# Patient Record
Sex: Male | Born: 1935 | Race: White | Hispanic: No | Marital: Married | Smoking: Former smoker
Health system: Southern US, Community
[De-identification: ages and names within clinical notes are randomized; demographics above are authoritative.]

## PROBLEM LIST (undated history)

## (undated) DIAGNOSIS — C801 Malignant (primary) neoplasm, unspecified: Secondary | ICD-10-CM

## (undated) DIAGNOSIS — G4730 Sleep apnea, unspecified: Secondary | ICD-10-CM

## (undated) DIAGNOSIS — F419 Anxiety disorder, unspecified: Secondary | ICD-10-CM

## (undated) DIAGNOSIS — M1990 Unspecified osteoarthritis, unspecified site: Secondary | ICD-10-CM

## (undated) DIAGNOSIS — I10 Essential (primary) hypertension: Secondary | ICD-10-CM

## (undated) DIAGNOSIS — J8410 Pulmonary fibrosis, unspecified: Secondary | ICD-10-CM

## (undated) DIAGNOSIS — K219 Gastro-esophageal reflux disease without esophagitis: Secondary | ICD-10-CM

## (undated) DIAGNOSIS — E119 Type 2 diabetes mellitus without complications: Secondary | ICD-10-CM

## (undated) HISTORY — PX: OTHER SURGICAL HISTORY: SHX169

## (undated) HISTORY — PX: PROSTATE SURGERY: SHX751

---

## 2005-11-07 ENCOUNTER — Ambulatory Visit: Payer: Self-pay | Admitting: Cardiology

## 2005-11-09 ENCOUNTER — Ambulatory Visit: Payer: Self-pay | Admitting: Cardiology

## 2005-11-09 ENCOUNTER — Inpatient Hospital Stay (HOSPITAL_BASED_OUTPATIENT_CLINIC_OR_DEPARTMENT_OTHER): Admission: RE | Admit: 2005-11-09 | Discharge: 2005-11-09 | Payer: Self-pay | Admitting: Cardiology

## 2005-11-18 ENCOUNTER — Ambulatory Visit: Payer: Self-pay | Admitting: Cardiology

## 2005-11-24 ENCOUNTER — Ambulatory Visit: Payer: Self-pay | Admitting: Cardiology

## 2005-12-19 ENCOUNTER — Ambulatory Visit: Payer: Self-pay | Admitting: Cardiology

## 2006-10-06 ENCOUNTER — Ambulatory Visit: Payer: Self-pay | Admitting: Cardiology

## 2006-10-24 ENCOUNTER — Encounter: Payer: Self-pay | Admitting: Internal Medicine

## 2006-11-28 ENCOUNTER — Encounter: Payer: Self-pay | Admitting: Internal Medicine

## 2007-02-16 ENCOUNTER — Ambulatory Visit: Payer: Self-pay | Admitting: Internal Medicine

## 2007-02-16 LAB — CONVERTED CEMR LAB
BUN: 23 mg/dL (ref 6–23)
Basophils Relative: 0.8 % (ref 0.0–1.0)
Calcium: 9.5 mg/dL (ref 8.4–10.5)
Chloride: 108 meq/L (ref 96–112)
Eosinophils Absolute: 0.3 10*3/uL (ref 0.0–0.6)
GFR calc Af Amer: 70 mL/min
GFR calc non Af Amer: 58 mL/min
Lymphocytes Relative: 27.7 % (ref 12.0–46.0)
MCV: 89.8 fL (ref 78.0–100.0)
Monocytes Relative: 10.2 % (ref 3.0–11.0)
Neutro Abs: 3.2 10*3/uL (ref 1.4–7.7)
Platelets: 230 10*3/uL (ref 150–400)
Pro B Natriuretic peptide (BNP): 50 pg/mL (ref 0.0–100.0)
RBC: 3.85 M/uL — ABNORMAL LOW (ref 4.22–5.81)
Sed Rate: 38 mm/hr — ABNORMAL HIGH (ref 0–20)
TSH: 1.73 microintl units/mL (ref 0.35–5.50)
WBC: 5.7 10*3/uL (ref 4.5–10.5)

## 2007-03-27 ENCOUNTER — Ambulatory Visit: Payer: Self-pay | Admitting: Internal Medicine

## 2007-04-12 ENCOUNTER — Encounter: Payer: Self-pay | Admitting: Internal Medicine

## 2007-04-12 DIAGNOSIS — E119 Type 2 diabetes mellitus without complications: Secondary | ICD-10-CM

## 2007-04-12 DIAGNOSIS — G4730 Sleep apnea, unspecified: Secondary | ICD-10-CM | POA: Insufficient documentation

## 2007-04-12 DIAGNOSIS — I10 Essential (primary) hypertension: Secondary | ICD-10-CM | POA: Insufficient documentation

## 2007-05-10 ENCOUNTER — Ambulatory Visit: Payer: Self-pay | Admitting: Internal Medicine

## 2007-05-10 DIAGNOSIS — J8410 Pulmonary fibrosis, unspecified: Secondary | ICD-10-CM

## 2007-10-10 ENCOUNTER — Ambulatory Visit: Payer: Self-pay | Admitting: Internal Medicine

## 2007-11-27 ENCOUNTER — Ambulatory Visit: Payer: Self-pay | Admitting: Internal Medicine

## 2007-11-27 DIAGNOSIS — R05 Cough: Secondary | ICD-10-CM

## 2007-11-27 DIAGNOSIS — R0602 Shortness of breath: Secondary | ICD-10-CM

## 2007-11-27 DIAGNOSIS — R059 Cough, unspecified: Secondary | ICD-10-CM | POA: Insufficient documentation

## 2007-12-31 ENCOUNTER — Ambulatory Visit: Payer: Self-pay | Admitting: Internal Medicine

## 2008-01-02 LAB — CONVERTED CEMR LAB
BUN: 25 mg/dL — ABNORMAL HIGH (ref 6–23)
Basophils Absolute: 0.1 10*3/uL (ref 0.0–0.1)
Calcium: 8.7 mg/dL (ref 8.4–10.5)
Creatinine, Ser: 1.6 mg/dL — ABNORMAL HIGH (ref 0.4–1.5)
Eosinophils Absolute: 0.5 10*3/uL (ref 0.0–0.7)
GFR calc non Af Amer: 45 mL/min
Lymphocytes Relative: 27.1 % (ref 12.0–46.0)
Monocytes Absolute: 0.7 10*3/uL (ref 0.1–1.0)
Monocytes Relative: 12.6 % — ABNORMAL HIGH (ref 3.0–12.0)
Neutrophils Relative %: 50.6 % (ref 43.0–77.0)
Platelets: 198 10*3/uL (ref 150–400)
Pro B Natriuretic peptide (BNP): 54 pg/mL (ref 0.0–100.0)
RDW: 16.1 % — ABNORMAL HIGH (ref 11.5–14.6)
WBC: 5.7 10*3/uL (ref 4.5–10.5)

## 2008-05-23 DIAGNOSIS — J8410 Pulmonary fibrosis, unspecified: Secondary | ICD-10-CM

## 2008-05-23 HISTORY — DX: Pulmonary fibrosis, unspecified: J84.10

## 2008-05-26 ENCOUNTER — Telehealth (INDEPENDENT_AMBULATORY_CARE_PROVIDER_SITE_OTHER): Payer: Self-pay | Admitting: *Deleted

## 2009-04-08 ENCOUNTER — Ambulatory Visit: Payer: Self-pay | Admitting: Internal Medicine

## 2009-04-08 DIAGNOSIS — N259 Disorder resulting from impaired renal tubular function, unspecified: Secondary | ICD-10-CM | POA: Insufficient documentation

## 2009-04-09 LAB — CONVERTED CEMR LAB
BUN: 21 mg/dL (ref 6–23)
Basophils Relative: 1.2 % (ref 0.0–3.0)
CO2: 26 meq/L (ref 19–32)
Calcium: 9.1 mg/dL (ref 8.4–10.5)
Creatinine, Ser: 1.7 mg/dL — ABNORMAL HIGH (ref 0.4–1.5)
Eosinophils Absolute: 0.4 10*3/uL (ref 0.0–0.7)
Iron: 144 ug/dL (ref 42–165)
Lymphocytes Relative: 18.5 % (ref 12.0–46.0)
MCHC: 33.1 g/dL (ref 30.0–36.0)
MCV: 95.8 fL (ref 78.0–100.0)
Monocytes Absolute: 0.9 10*3/uL (ref 0.1–1.0)
Neutrophils Relative %: 61 % (ref 43.0–77.0)
Platelets: 270 10*3/uL (ref 150.0–400.0)
RBC: 3.42 M/uL — ABNORMAL LOW (ref 4.22–5.81)
Saturation Ratios: 34.5 % (ref 20.0–50.0)
Transferrin: 297.9 mg/dL (ref 212.0–360.0)
WBC: 7.1 10*3/uL (ref 4.5–10.5)

## 2009-04-30 ENCOUNTER — Telehealth (INDEPENDENT_AMBULATORY_CARE_PROVIDER_SITE_OTHER): Payer: Self-pay | Admitting: *Deleted

## 2009-04-30 ENCOUNTER — Ambulatory Visit: Payer: Self-pay | Admitting: Pulmonary Disease

## 2009-05-01 ENCOUNTER — Telehealth (INDEPENDENT_AMBULATORY_CARE_PROVIDER_SITE_OTHER): Payer: Self-pay | Admitting: *Deleted

## 2009-05-06 ENCOUNTER — Encounter: Payer: Self-pay | Admitting: Pulmonary Disease

## 2010-01-26 ENCOUNTER — Telehealth (INDEPENDENT_AMBULATORY_CARE_PROVIDER_SITE_OTHER): Payer: Self-pay | Admitting: *Deleted

## 2010-02-16 ENCOUNTER — Ambulatory Visit: Payer: Self-pay | Admitting: Cardiology

## 2010-04-27 ENCOUNTER — Ambulatory Visit: Payer: Self-pay | Admitting: Internal Medicine

## 2010-04-28 ENCOUNTER — Telehealth: Payer: Self-pay | Admitting: Internal Medicine

## 2010-04-30 ENCOUNTER — Ambulatory Visit: Payer: Self-pay | Admitting: Internal Medicine

## 2010-06-22 NOTE — Assessment & Plan Note (Signed)
Summary: Pulmonary/ ext f/u PF with desat x 1 lap > hrct next   Copy to:  Dr. Sherene Sires Primary Provider/Referring Provider:  Wyvonnia Lora  CC:  Dyspnea- the same.  History of Present Illness: 77 -yowm quit smoking in 1972  with pulmonary fibrosis by chest x-ray the dating back to at least June of 2007 corresponding to complaints of mild dyspnea on exertion.  PFTs dated November 2008 indicated diffusing capacity of only 38% predicted.  However, there was really no significant overall change in the chest x-ray pattern that dating back to June of 2007.  The patient already qualified for both nocturnal bipap ([place]) and oxygen with exercise although he admits he does not use oxygen with exercise (doesn't feel it helps).    The other symptom that was bothering him was chronic dry upper airway daytime cough that seemed better when he switched from lisinopril to Benicar     April 08, 2009 ov Acute visit.  Pt states that his breathing has progressively worsened over the past 6 months.  He states that he gets SOB just walking from room to room.  Also c/o cough worsening.  Sometimes cough is prod with clear sputum and worse after eats, now  Uses [facility] parking rec    April 27, 2010 ov cc doe better to point where can go walk around large store wihout 02 using 4 pronged walker and no cough, no noct co's.  Pt denies any significant sore throat, dysphagia, itching, sneezing,  nasal congestion or excess secretions,  fever, chills, sweats, unintended wt loss, pleuritic or exertional cp, hempoptysis, variability  in activity tolerance  orthopnea pnd or leg swelling. Pt also denies any obvious fluctuation in symptoms with weather or environmental change or other alleviating or aggravating factors.       Current Medications (verified): 1)  Adprin B 325 Mg  Tabs (Aspirin Buf(Cacarb-Mgcarb-Mgo)) .... Once Daily 2)  Benefiber  Powd (Wheat Dextrin) .... As Directed  Every Morning 3)  Humulin 70/30 70-30 %  Susp  (Insulin Isophane & Regular) .... 45 Units Two Times A Day 4)  Calcuim D 600mg  .... 2 Tab Once Daily 5)  Tylenol Extra Strength 500 Mg  Tabs (Acetaminophen) .... As Needed 6)  Diovan 320 Mg Tabs (Valsartan) .... Take 1 Tablet By Mouth Once A Day 7)  Melatonin 3 Mg Tabs (Melatonin) .... 3 At Bedtime 8)  Multivitamins  Tabs (Multiple Vitamin) .Marland Kitchen.. 1 Once Daily 9)  Lyrica (? Strength) .Marland Kitchen.. 1 Once Daily 10)  * Cpap @ 10 and 3lpm At Bedtime 11)  Pepcid 20 Mg Tabs (Famotidine) .... Take 1 Tablet By Mouth Once A Day As Needed 12)  Diazepam 5 Mg Tabs (Diazepam) .... As Needed For Sleep 13)  Magnesium Oxide 250 Mg Tabs (Magnesium Oxide) .... Take 1 Tablet By Mouth Three Times A Day 14)  Hydrocodone-Acetaminophen 5-500 Mg Tabs (Hydrocodone-Acetaminophen) .Marland Kitchen.. 1 Every 4-6 Hrs As Needed  Allergies (verified): No Known Drug Allergies  Past History:  Past Medical History: OBESITY, MORBID (ICD-278.01) PULMONARY FIBROSIS ILD POST INFLAMMATORY CHRONIC (ICD-515).................Marland KitchenWert   - PFTs 11//4/08 vital capacity 75% diffusing capacity 38%                11/27/07   vital capacity 72% diffusing capacity 41%               04/30/09  Vital capacity 71%diffusing capacity 41% TUBERCULOSIS EXPOSURE (ICD-V01.1) SLEEP APNEA (ICD-780.57) DM (ICD-250.00) HYPERTENSION (ICD-401.9)  Vital Signs:  Patient profile:  77 year old male Weight:      214 pounds BMI:     30.82 O2 Sat:      96 % on Room air Temp:     97.9 degrees F oral Pulse rate:   70 / minute BP sitting:   110 / 60  (left arm)  Vitals Entered By: Vernie Murders (April 27, 2010 1:39 PM)  O2 Flow:  Room air  Serial Vital Signs/Assessments:  Comments: 2:34 PM Ambulatory Pulse Oximetry  Resting; HR__69___    02 Sat_98%ra____  Lap1 (185 feet)   HR_94____   02 Sat__86%ra___ Lap2 (185 feet)   HR_____   02 Sat_____    Lap3 (185 feet)   HR_____   02 Sat_____  ___Test Completed without Difficulty _x__Test Stopped due QI:ONGEXBMWU o2  sat   By: Vernie Murders    Physical Exam  Additional Exam:  Ambulatory healthy appearing obese white male  in no acute distress. 240 > 233  > 214 April 27, 2010  HEENT: nl dentition, turbinates, and orophanx. Nl external ear canals without cough reflex Neck without JVD/Nodes/TM Lungs  without cough on insp or exp maneuvers, minimal crackles in bases on deep inspiration RRR no s3 or murmur or increase in P2 or edema Abd soft and benign with nl excursion in the supine position. No bruits or organomegaly Ext warm without calf tenderness, cyanosis clubbing Skin warm and dry without lesions    CXR  Procedure date:  04/27/2010  Findings:      No significant change in appearance of prominent interstitial fibrotic disease.  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 still candidate for study    DDx for pulmonary fibrosis with honeycombing includes idiopathic pulmonary fibrosis, pulmonary fibrosis associated with rheumatologic disease, adverse effect from  drugs such as chemotherapy or amiodarone exposure, nonspecific interstitial pneumonia which is typically steroid responsive, and chronic hypersensitivity pneumonitis.   Absence of clubbing and progression against uip, will check hrct to complete the w/u then return here if ct suggestive >  if qualifies for UIP study   Medications Added to Medication List This Visit: 1)  Benefiber Powd (Wheat dextrin) .... As directed  every morning 2)  Lyrica (? Strength)  .Marland Kitchen.. 1 once daily 3)  Pepcid 20 Mg Tabs (Famotidine) .... Take 1 tablet by mouth once a day as needed 4)  Hydrocodone-acetaminophen 5-500 Mg Tabs (Hydrocodone-acetaminophen) .Marland Kitchen.. 1 every 4-6 hrs as needed  Other Orders: Pulse Oximetry (94760) Est. Patient Level IV (99214) T-2 View CXR (71020TC) Est. Patient Level IV (99214) Pulse Oximetry, Ambulatory (13244) Misc.  Referral (Misc. Ref)  Patient Instructions: 1)  Return to clinic in 6 months 2)  We will call you with cxr report and decide on High Res CT then 3)  ADD  needs non contast HRCT next then I will call him the results

## 2010-06-22 NOTE — Progress Notes (Signed)
Summary: does not qualify for ASCEND  Phone Note Outgoing Call   Summary of Call: Phillip [name], Based on PFT criteria this patient does NOT qualify for ASCEND Study. Cannot remember if I already told you or not. tHe fev1/fvc ratio in dec 2010 is below 80 Thanks, MR Initial call taken by: Kalman Shan MD,  April 28, 2010 11:48 AM  Follow-up for Phone Call        ok Follow-up by: [name] MD,  April 28, 2010 12:07 PM

## 2010-06-22 NOTE — Progress Notes (Signed)
Summary: appt scheduled to discuss new study drug  ---- Converted from flag ---- ---- 01/24/2010 11:34 PM, [name] MD wrote: needs ov to re-eval his lung dz and discuss possibility of new drug study which will probably pay for a free CT scan, which he needs anyway at this point ------------------------------  Spoke with pt and sched ov for 02/08/10 at 9 am. Phillip [name]  January 26, 2010 3:03 PM  Appended Document: appt scheduled to discuss new study drug Did not see on 9/19 - see if he will come back to office to regroup,  probably will qualify for a new study which may help him   Appended Document: appt scheduled to discuss new study drug Spoke with pt and sched appt for 04/27/10 @1 :30 pm.

## 2010-10-05 NOTE — Assessment & Plan Note (Signed)
[facility]                             PULMONARY OFFICE NOTE   NAME:Phillip [name], Phillip [name]                        MRN:          [id number]  DATE:02/16/2007                            DOB:          [birth year]    PULMONARY CONSULTATION:   REASON FOR CONSULTATION:  Dyspnea.   HISTORY:  A 77 year old white male with the unusual complaint of  shortness of breath after walking 30 minutes.  He says he can walk  between a mile and a mile and a half during that time and is no better  after Advair/Spiriva.  He was evaluated with a decreased saturation on a  6-minute walk but has found that he is no better when he wears oxygen.  He denies any variability with weather or environment change, orthopnea,  PND, or leg swelling, fevers, chills, sweats, chest pain of any kind, or  nocturnal complaints.   PAST MEDICAL HISTORY:  Significant for:  1. Hypertension.  2. Diabetes.  3. Diagnosis of sleep apnea.  4. Remote tuberculosis exposure (mother had TB 50 years ago).  5. He is status post colonoscopy in August 2008, for rectal bleeding.   ALLERGIES:  None.   MEDICATIONS:  Taken in detail on the worksheet.  And significant for the  fact that he is on Lisinopril 40 mg daily.   SOCIAL HISTORY:  He quit smoking in 1972.  Weight 185 pounds then, has  gained 50 pounds subsequently.  He is retired but no unusual travel,  pet, or hobby exposure.   FAMILY HISTORY:  Negative for respiratory disease or atypia.   REVIEW OF SYSTEMS:  Taken in detail on the worksheet, negative except as  outlined above.   PHYSICAL EXAMINATION:  GENERAL:  This is a pleasant, moderately obese,  ambulatory white male in no acute distress.  VITAL SIGNS:  He is afebrile with normal vital signs.  HEENT:  Unremarkable.  Oropharynx is clear.  Nasal turbinates normal.  Ear canals clear bilaterally.  NECK:  Supple without cervical adenopathy or tenderness.  Trachea is  midline.  No thyromegaly.  LUNGS:  Lung fields perfectly clear bilaterally on auscultation  percussion.  There was minimal pseudowheeze present.  HEART:  Regular rhythm without murmur, gallop, or rub present.  ABDOMEN:  Soft, benign with no palpable organomegaly, mass, or  tenderness.  EXTREMITIES:  Warm without calf tenderness, cyanosis, clubbing, edema.   No recent x-rays available.   IMPRESSION:  This patient probably has at least mild to moderate chronic  obstructive pulmonary disease based on his history but interestingly he  had no symptoms when he quit smoking in 1972, prior to gaining 50  pounds.  He now has a predominant pseudowheeze on exam which may or may  not actually be a limiting mechanism but nonetheless needs to be  addressed before returning here for a followup set of PFTs.   I recommended the following specifics today:  1. Stop fish oil and institute a GERD diet since many patients with      pseudowheeze and ACE inhibitor effects also have reflux.  2. Switch  from Lisinopril to Benicar 40 mg daily for the next 6 weeks      before returning here with PFTs.  3. When he returns I would like to do a new x-ray and compare it with      any old x-rays that are available at that time.     Charlaine Dalton. Sherene Sires, MD, [facility]  Electronically Signed    MBW/MedQ  DD: 02/18/2007  DT: 02/18/2007  Job #: [id number]   cc:   Wyvonnia Lora

## 2010-10-08 NOTE — Cardiovascular Report (Signed)
NAMETEANDRE, HAMRE NO.:  [id number]   MEDICAL RECORD NO.:  [id number]          PATIENT TYPE:  OIB   LOCATION:  1962                         FACILITY:  [facility]   PHYSICIAN:  Charlies Constable, M.D. [facility] DATE OF BIRTH:  08/12/1932   DATE OF PROCEDURE:  11/09/2005  DATE OF DISCHARGE:                              CARDIAC CATHETERIZATION   HISTORY:  Phillip [name] is 55 years and has multiple risk factors including  hyperlipidemia, diabetes, and a history of coronary heart disease.  He also  has known peripheral vascular disease with left calf claudication.  Recently  he has developed symptoms of dyspnea on exertion and Dr. [name] arranged for  him to come to the outpatient laboratory for evaluation with angiography.  He also has a history of hypertension.   PROCEDURE:  The procedure was performed by the right femoral arterial sheath  and 4-French preformed coronary catheters.  A front wall arterial puncture  was performed and Omnipaque contrast was used.  Distal aortogram was  performed to rule out renovascular causes for hypertension.  Patient  tolerated procedure well and left the laboratory in satisfactory condition.   RESULTS:  The aortic pressure was 102/58 with mean of 75.  Left ventricular  pressure was 102/8.   The left main coronary had a 30% distal stenosis.   The left anterior descending artery was diffusely irregular.  There was 40%  narrowing in the proximal vessel and 70% narrowing in the distal vessel.  First diagonal branch had a long segmental 50% narrowing.  A second diagonal  branch had a 70% stenosis.  There was TIMI 2 flow distally.   The circumflex artery gave rise to an atrial branch, a first marginal  branch, a second marginal branch, and a small and large posterolateral  branch.  This vessel was also irregular.  There was 70% narrowing near the  ostium of the second marginal branch and there were tandem 40% stenoses in  the mid to distal  vessel.   The right coronary artery is a moderate sized vessel.  It gave rise to a  right ventricle branch, a posterior branch, and four small posterolateral  branches.  This vessel was also irregular and there was 40% narrowing in the  proximal vessel and 40% narrowing in the proximal portion of the posterior  descending artery and 40% narrowing in the AV branch after the posterior  descending branch.  There was also TIMI 2 flow distally, especially in the  posterior descending branch.   The left ventriculogram performed in the RAO projection showed good wall  motion with no areas of hypokinesis.  The estimated fraction was 60%.   A distal aortogram was performed which showed patent renal arteries and no  significant aortoiliac obstruction.   CONCLUSION:  Non-obstructive coronary artery disease with 30% narrowing in  the distal left main coronary artery, 40% proximal and 70% distal stenosis  in the left anterior descending artery with 70% stenosis in the second  diagonal branch, 70% stenosis in the second marginal branch of the  circumflex artery, 40% proximal stenosis in  the right coronary and 40%  stenosis in the posterior descending branch of the right coronary artery  with normal left ventricular function.   RECOMMENDATIONS:  Patient has non-obstructive coronary artery disease.  He  may have some microvascular dysfunction since there is TIMI 2 flow in the  LAD and right coronary arteries.  I am not sure that this is responsible for  his recent symptoms of dyspnea.  I will discuss these findings with Dr.  [name] and he may need to pursue other causes for his dyspnea.           ______________________________  Charlies Constable, M.D. [facility]     BB/MEDQ  D:  11/09/2005  T:  11/09/2005  Job:  [id number]   cc:   Learta Codding, M.D. [facility]  [address]  [address]  [place]  [place] [address]   Wyvonnia Lora  Fax: [phone]   [facility]

## 2010-10-08 NOTE — Assessment & Plan Note (Signed)
[facility]                             PULMONARY OFFICE NOTE   NAME:Phillip [name], Phillip [name]                        MRN:          [id number]  DATE:03/27/2007                            DOB:          [birth year]    HISTORY:  A 77 year old, white male with pulmonary fibrosis dating back  to at least June 2007 by chest x-rays that the patient brought with him  today complaining of gradually increasing dyspnea that occurs with  exertion and is associated with progressive weight grain that he  attributes to using Actos for diabetes. He denies any variability in  terms of dyspnea, associated chest pain, fever, chills, sweats,  orthopnea, PND or leg swelling or significant cough.   His medications at this point were reviewed in detail and no longer  include Lisinopril. He is not convinced that Benicar substituting for  lisinopril made any difference in terms of his symptoms.   PHYSICAL EXAMINATION:  GENERAL:  He is a pleasant, ambulatory, white  male in no acute distress.  VITAL SIGNS:  He had stable vital signs.  HEENT:  Unremarkable. Pharynx clear.  LUNGS:  Lung fields reveal trace crackles in the bases only.  HEART:  He has a regular rate and rhythm without murmur, gallop or rub.  No increase in P2.  ABDOMEN:  Soft and benign.  EXTREMITIES:  Warm without calf tenderness, cyanosis, clubbing or edema.   Chest x-ray is performed today and does show a very mild fibrotic  change. This is only slightly increased from the original x-ray he  brought with him from June 2007 associated with CT scan showing mild  honeycombing.   Lab data was reviewed with the patient in detail from his visit on  September 26 indicating hematocrit of 34.5% with normal indices and sed  rate of only 38 and BNP that was normal at 50.   IMPRESSION:  Although this patient has pulmonary fibrosis, it is  extremely mild both radiographically and clinically. I believe what has  caused him more  symptoms than anything else is gaining weight in the  abdominal compartment and of all of his problems this is potentially the  most reversible. He is convinced the Actos is the culprit. The more  likely explanation is that his total caloric intake was excessive at the  time Actos was started and that he no longer has any significant  glucosuria. He should be able to take advantage of this by modifying his  diet carefully and also increasing his caloric output by doing paced  exercise to the point where he is short of breath but not out of breath  30 minutes daily.   I am not convinced at this point there is enough progressive pattern  here to warrant antiinflammatory therapy, which would not likely be of  help anyway. He is beyond the age where normally one considers lung  transplantation.   At that point, this leaves [place] with a watch and wait approach hoping  that the fibrosis will not be life limiting.   I did review with him my previous  recommendations to avoid anything that  might promote reflux. I am not convinced there is any benefit to using N-  acetylcysteine; I have asked him to stop, but he was not convinced that  there was any benefit switching off of ACE inhibitors (which obviously  do not cause pulmonary fibrosis) which can mimic some of the symptoms  one would be on the lookout for and empirically I suggested he try it  but since it did not make any difference I recommended he switch back to  the ACE inhibitors to see if any symptoms exacerbate.   Followup will be in 3 months with another chest x-ray and set of PFTs.     Charlaine Dalton. Sherene Sires, MD, [facility]  Electronically Signed    MBW/MedQ  DD: 03/28/2007  DT: 03/29/2007  Job #: [id number]   cc:   Wyvonnia Lora

## 2010-10-08 NOTE — Assessment & Plan Note (Signed)
HEALTHCARE                            [facility] OFFICE NOTE   NAME:[name], [name]                        MRN:          [id number]  DATE:12/19/2005                            DOB:          1932-12-22   DATE OF VISIT:  December 19, 2005.   PRIMARY CARE PHYSICIAN:  Wyvonnia Lora, MD.   SUPERVISING CARDIOLOGIST:  Learta Codding, MD.   SUMMARY OF HISTORY:  Phillip [name] is a 77 year old white male, who was  initially seen in our office on November 07, 2005, for chest discomfort and  shortness of breath.  He underwent a cardiac catheterization on November 09, 2005, to further evaluate his symptoms.  It was noted that he had non-  obstructive coronary artery disease.  He was seen back in the office on  followup on November 18, 2005, with continued symptoms.  A stress test was  performed on November 24, 2005.  This showed an EF of 56%, mild inferior  hypokinesis with an inferior infra-apical non-reversible defect indicative  of prior infarct or scar.  Attentuation could not be ruled out, and it was  felt to be a low risk study.  Of note, with his lower extremity discomfort,  lower extremity Dopplers were also performed and showed minor small vessel  disease present.   Phillip [name] returns to the office today for discussion of the above-tests and  for further evaluation on the symptoms.  Phillip [name] states that since his  last visit and recent tests, he has not had any problems with shortness of  breath or chest discomfort.  He states that this is resolved.  After the  above-mentioned tests were reviewed, he does not have any questions in  regards to these findings.   In review of cardiac risk factors, he states that his sugars are under good  control and he does walk on his treadmill approximately 30 minutes and 1  mile every day plus he is very active outside.  He states that he is not  following his diet as he should and, in fact, he has probably gained a  couple of pounds,  which he attributes to his diabetic medications.  At this  time, he denies any problems of shortness of breath, chest discomfort,  palpitations, syncope, edema.   PAST MEDICAL HISTORY:  No known drug allergies.   MEDICATIONS:  1.  Sulindac 200 mg b.i.d.  2.  Lisinopril 40 mg daily.  3.  HCTZ 25 mg daily.  4.  Norvasc 5 mg daily.  5.  Vanquish daily.  6.  Aspirin 325 mg daily.  7.  Benefiber.  8.  Humulin insulin 70-30, unknown frequency.  9.  Fish oil 1000 mg daily.  10. Calcium 600 mg daily.  11. Imdur 30 mg daily.  12. He states that he has run out of the samples of Lipitor 20 mg that he      was given.   PAST MEDICAL HISTORY:  1.  Nonobstructive coronary artery disease with normal LV function by      catheterization in June 2007.  2.  Intermittent claudication, left greater than right, without evidence of      significant distal atherosclerosis by recent angiography.  3.  Insulin-requiring diabetes.  4.  Hypertension.  5.  Obesity.  6.  Hyperlipidemia.  Last cholesterol checked that we have is from Dr.      [name] office in October 2006 with a total cholesterol of 185,      triglycerides 566, HDL 29, LDL was not calculable.   PHYSICAL EXAMINATION:  GENERAL:  A well-nourished, well-developed, pleasant  white male in no apparent distress.  VITAL SIGNS:  Blood pressure is 150/80, pulse 60 and regular, weight 224.  HEENT:  Unremarkable.  NECK:  Supple without thyromegaly, adenopathy, JVD, or carotid bruits.  CHEST:  Symmetrical excursion; lungs are clear to auscultation in all  fields.  HEART:  Regular rate and rhythm with normal S1 and S2; no appreciable  murmurs.  ABDOMEN:  Obese; bowel sounds present without organomegaly, masses, or  tenderness.  EXTREMITIES:  No cyanosis, clubbing, or edema.  He does have some  varicosities in his lower extremity.  Left pedal pulses are slightly  diminished compared to the right pedal pulses.   IMPRESSION:  1.  Resolution of  chest discomfort and shortness of breath of uncertain      etiology, with catheterization showing nonobstructive coronary artery      disease, and a stress Adenosine-Myoview negative for ischemia.  2.  Multiple cardiac risk factors, including obesity, remote tobacco use,      diabetes, hyperlipidemia, and hypertension.   DISPOSITION:  I spent some time with Phillip [name] and his wife in reviewing  modification of cardiac risk factors such as weight loss, Statin medications  in the setting of hyperlipidemia and coronary artery disease, glucose  control, and blood pressure control.  Phillip [name] states that he will make  attempts to increase his exercise and improve his diet.  He will obtain a  blood pressure cuff and begin blood pressure readings at home and bring  these to Dr. [name] for review.  Medications will be adjusted to maintain an  optimal blood pressure.  Phillip [name] states  that he will also discuss with Dr. [name] continuing medications for his  hyperlipidemia, that he is on a very fixed income.  He will see Dr. [name]  back in the office as needed.                                   Joellyn Rued, PA-C  EW/MedQ  DD:  12/19/2005  DT:  12/19/2005  Job #:  [id number]   cc:   Learta Codding, MD, [facility]  Wyvonnia Lora

## 2010-11-12 ENCOUNTER — Ambulatory Visit: Payer: Self-pay | Admitting: Internal Medicine

## 2010-11-18 ENCOUNTER — Ambulatory Visit: Payer: Self-pay | Admitting: Internal Medicine

## 2012-10-29 ENCOUNTER — Ambulatory Visit: Payer: Self-pay | Admitting: Internal Medicine

## 2012-11-06 ENCOUNTER — Ambulatory Visit: Payer: Self-pay | Admitting: Internal Medicine

## 2012-11-09 DIAGNOSIS — M6281 Muscle weakness (generalized): Secondary | ICD-10-CM

## 2012-11-12 DIAGNOSIS — I219 Acute myocardial infarction, unspecified: Secondary | ICD-10-CM

## 2012-11-13 ENCOUNTER — Other Ambulatory Visit (HOSPITAL_COMMUNITY): Payer: Self-pay | Admitting: Internal Medicine

## 2012-11-13 DIAGNOSIS — C3490 Malignant neoplasm of unspecified part of unspecified bronchus or lung: Secondary | ICD-10-CM

## 2012-11-15 ENCOUNTER — Ambulatory Visit (HOSPITAL_COMMUNITY)
Admission: RE | Admit: 2012-11-15 | Discharge: 2012-11-15 | Disposition: A | Discharge status: Home / Self Care | Payer: Medicare Other | Source: Ambulatory Visit | Attending: Internal Medicine | Admitting: Internal Medicine

## 2012-11-15 ENCOUNTER — Encounter (HOSPITAL_COMMUNITY)
Admission: RE | Admit: 2012-11-15 | Discharge: 2012-11-15 | Disposition: A | Discharge status: Home / Self Care | Payer: Medicare Other | Source: Ambulatory Visit | Attending: Internal Medicine | Admitting: Internal Medicine

## 2012-11-15 ENCOUNTER — Encounter (HOSPITAL_COMMUNITY): Payer: Self-pay

## 2012-11-15 ENCOUNTER — Telehealth: Payer: Self-pay | Admitting: *Deleted

## 2012-11-15 ENCOUNTER — Encounter: Payer: Self-pay | Admitting: *Deleted

## 2012-11-15 DIAGNOSIS — K7689 Other specified diseases of liver: Secondary | ICD-10-CM | POA: Insufficient documentation

## 2012-11-15 DIAGNOSIS — C7951 Secondary malignant neoplasm of bone: Secondary | ICD-10-CM | POA: Insufficient documentation

## 2012-11-15 DIAGNOSIS — R161 Splenomegaly, not elsewhere classified: Secondary | ICD-10-CM | POA: Insufficient documentation

## 2012-11-15 DIAGNOSIS — E119 Type 2 diabetes mellitus without complications: Secondary | ICD-10-CM | POA: Insufficient documentation

## 2012-11-15 DIAGNOSIS — C773 Secondary and unspecified malignant neoplasm of axilla and upper limb lymph nodes: Secondary | ICD-10-CM | POA: Insufficient documentation

## 2012-11-15 DIAGNOSIS — C7952 Secondary malignant neoplasm of bone marrow: Secondary | ICD-10-CM | POA: Insufficient documentation

## 2012-11-15 DIAGNOSIS — Z794 Long term (current) use of insulin: Secondary | ICD-10-CM | POA: Insufficient documentation

## 2012-11-15 DIAGNOSIS — C770 Secondary and unspecified malignant neoplasm of lymph nodes of head, face and neck: Secondary | ICD-10-CM | POA: Insufficient documentation

## 2012-11-15 DIAGNOSIS — C3490 Malignant neoplasm of unspecified part of unspecified bronchus or lung: Secondary | ICD-10-CM | POA: Insufficient documentation

## 2012-11-15 DIAGNOSIS — R599 Enlarged lymph nodes, unspecified: Secondary | ICD-10-CM | POA: Insufficient documentation

## 2012-11-15 DIAGNOSIS — K219 Gastro-esophageal reflux disease without esophagitis: Secondary | ICD-10-CM | POA: Insufficient documentation

## 2012-11-15 DIAGNOSIS — Z87891 Personal history of nicotine dependence: Secondary | ICD-10-CM | POA: Insufficient documentation

## 2012-11-15 DIAGNOSIS — I10 Essential (primary) hypertension: Secondary | ICD-10-CM | POA: Insufficient documentation

## 2012-11-15 HISTORY — DX: Type 2 diabetes mellitus without complications: E11.9

## 2012-11-15 HISTORY — DX: Gastro-esophageal reflux disease without esophagitis: K21.9

## 2012-11-15 HISTORY — DX: Sleep apnea, unspecified: G47.30

## 2012-11-15 HISTORY — DX: Pulmonary fibrosis, unspecified: J84.10

## 2012-11-15 HISTORY — DX: Essential (primary) hypertension: I10

## 2012-11-15 HISTORY — DX: Unspecified osteoarthritis, unspecified site: M19.90

## 2012-11-15 MED ORDER — FENTANYL CITRATE 0.05 MG/ML IJ SOLN
INTRAMUSCULAR | Status: AC
  Filled 2012-11-15: qty 6

## 2012-11-15 MED ORDER — MIDAZOLAM HCL 2 MG/2ML IJ SOLN
INTRAMUSCULAR | Status: AC | PRN
  Administered 2012-11-15 (×2): 0.5 mg via INTRAVENOUS
  Administered 2012-11-15: 1 mg via INTRAVENOUS

## 2012-11-15 MED ORDER — FENTANYL CITRATE 0.05 MG/ML IJ SOLN
INTRAMUSCULAR | Status: AC | PRN
  Administered 2012-11-15: 50 ug via INTRAVENOUS
  Administered 2012-11-15 (×2): 25 ug via INTRAVENOUS

## 2012-11-15 MED ORDER — MIDAZOLAM HCL 2 MG/2ML IJ SOLN
INTRAMUSCULAR | Status: AC
  Filled 2012-11-15: qty 6

## 2012-11-15 MED ORDER — FLUDEOXYGLUCOSE F - 18 (FDG) INJECTION
16.4000 | Freq: Once | INTRAVENOUS | Status: AC | PRN
  Administered 2012-11-15: 16.4 via INTRAVENOUS

## 2012-11-15 NOTE — Procedures (Signed)
Technically successful US guided biopsy of right supraclavicular lymph node.  No immediate complications.

## 2012-11-15 NOTE — Progress Notes (Signed)
Faxed appt time and day to referring office Dr. Cleone Slim with confirmation

## 2012-11-15 NOTE — Telephone Encounter (Signed)
Pt has an appt for [facility] 11/22/12 at 2:00 arrive at 1:30.  She verbalized understanding of time and place of appt

## 2012-11-15 NOTE — H&P (Signed)
Phillip [name] is an 79 y.o. male.   Chief Complaint: "I'm having a biopsy" HPI: Patient with past history of smoking and recent PET scan revealing bilateral lung lesions, right hilar/paratracheal/supraclavicular/porta hepatic/portacaval adenopathy, liver and bone lesions presents today for US guided rt supraclavicular lymph node biopsy.  Past Medical History  Diagnosis Date  . Hypertension   . Pulmonary fibrosis 2010  . Sleep apnea   . Diabetes mellitus without complication   . GERD (gastroesophageal reflux disease)   . Arthritis     Past Surgical History  Procedure Laterality Date  . Prostate surgery      Patient wife states they rimmed out prostate    History reviewed. No pertinent family history. Social History:  reports that he quit smoking about 39 years ago. His smoking use included Cigarettes and Pipe. He has a 20 pack-year smoking history. He has never used smokeless tobacco. He reports that he does not drink alcohol. His drug history is not on file.  Allergies: No Known Allergies  Current outpatient prescriptions:allopurinol (ZYLOPRIM) 100 MG tablet, Take 200 mg by mouth daily., Disp: , Rfl: ;  bumetanide (BUMEX) 1 MG tablet, Take 1 mg by mouth daily., Disp: , Rfl: ;  aspirin 81 MG tablet, Take 81 mg by mouth daily., Disp: , Rfl: ;  colchicine 0.6 MG tablet, Take 0.6 mg by mouth 2 (two) times daily as needed (for gout)., Disp: , Rfl:  glucosamine-chondroitin 500-400 MG tablet, Take 1 tablet by mouth 2 (two) times daily., Disp: , Rfl: ;  HYDROcodone-acetaminophen (NORCO) 10-325 MG per tablet, Take 1 tablet by mouth every 6 (six) hours as needed for pain., Disp: , Rfl: ;  Insulin Isophane & Regular (NOVOLIN 70/30 RELION [place]), Inject 55 Units into the skin 2 (two) times daily., Disp: , Rfl: ;  levofloxacin (LEVAQUIN) 750 MG tablet, Take 750 mg by mouth daily., Disp: , Rfl:  losartan (COZAAR) 100 MG tablet, Take 100 mg by mouth daily., Disp: , Rfl: ;  Magnesium-Zinc (MAGNESIUM-CHELATED  ZINC PO), Take 1 tablet by mouth 3 (three) times daily., Disp: , Rfl: ;  Multiple Vitamin (MULTIVITAMIN WITH MINERALS) TABS, Take 1 tablet by mouth daily., Disp: , Rfl:  No current facility-administered medications for this encounter. Facility-Administered Medications Ordered in Other Encounters: fentaNYL (SUBLIMAZE) 0.05 MG/ML injection, , , , ;  midazolam (VERSED) 2 MG/2ML injection, , , ,   LABS 11/12/2012  [facility]          WBC   6.9  HGB 14.2  PLTS 123K   PT  14.7  INR 1.2  PTT 35.6 Results for orders placed during the hospital encounter of 11/15/12 (from the past 48 hour(s))  GLUCOSE, CAPILLARY     Status: Abnormal   Collection Time    11/15/12 11:20 AM      Result Value Range   Glucose-Capillary 104 (*) 70 - 99 mg/dL   Nm Pet Image Initial (pi) Skull Base To Thigh  11/15/2012   *RADIOLOGY REPORT*  Clinical Data: Initial treatment strategy for lung cancer.  NUCLEAR MEDICINE PET SKULL BASE TO THIGH  Fasting Blood Glucose:  104  Technique:  16.4 mCi F-18 FDG was injected intravenously. CT data was obtained and used for attenuation correction and anatomic localization only.  (This was not acquired as a diagnostic CT examination.) Additional exam technical data entered on technologist worksheet.  Comparison:  11/07/2012  Findings:  Neck: No hypermetabolic lymph nodes in the neck.  Chest:  Small bilateral pleural effusions are  identified.  Moderate changes of centrilobular emphysema.  There is a pulmonary nodule within the left upper lobe measuring 1.3 cm, image 62.  This has an SUV max equal to 16.  Right upper lobe perihilar mass is identified measuring 3 cm, image 69.  This has an SUV max equal to 15.30, image 69.  Hypermetabolic right hilar and right paratracheal lymph nodes are identified. Index right paratracheal lymph node measures 1.5 cm and has an SUV max equal to 25.6, image 68.  At the thoracic inlet there are two hypermetabolic right supraclavicular lymph nodes.  The largest measures 1  cm and has an SUV max equal to 19, image 39.  Abdomen/Pelvis:  There are multi focal hypermetabolic liver lesions identified within both lobes.  Left hepatic lobe lesion measures 2.2 cm and has an SUV max equal to 13.1, image 106.  There are multiple stones within the gallbladder.  Pancreas is normal.  The spleen measures 17 cm in craniocaudal dimension.  No focal splenic lesions identified.  The adrenal glands are normal.  Increased FDG uptake within the prostate gland is noted, nonspecific.  There are hypermetabolic portocaval and porta hepatic lymph nodes identified.  The portacaval node measures 2 cm and has an SUV max equal to 18.30.  Skeleton:  There is a hypermetabolic bone lesion within the L1 vertebra.  The SUV max associated with this lesion is equal to 8.6.  IMPRESSION:  1.  Bilateral pulmonary lesions are hypermetabolic consistent with lung cancer. 2.  Multiple hypermetabolic right hilar, right paratracheal and right supraclavicular lymph nodes consistent with metastatic adenopathy. 3.  Multi focal hypermetabolic liver lesions. 4. Hypermetabolic porta hepatic and portacaval adenopathy. 5.  Hypermetabolic bone metastasis to the lumbar spine. 6.  Splenomegaly.   Original Report Authenticated By: Signa Kell, M.D.    Review of Systems  Constitutional: Positive for weight loss and malaise/fatigue. Negative for fever and chills.  Respiratory: Negative for cough and shortness of breath.   Cardiovascular: Negative for chest pain.  Gastrointestinal: Positive for constipation. Negative for nausea, vomiting and abdominal pain.       Hiccups  Musculoskeletal: Negative for back pain.  Neurological: Negative for headaches.    Blood pressure 173/89, pulse 68, temperature 97.9 F (36.6 C), temperature source Oral, resp. rate 18, height 5\' 10"  (1.778 m), weight 198 lb (89.812 kg), SpO2 94.00%. Physical Exam  Constitutional: He is oriented to person, place, and time. He appears well-developed and  well-nourished.  Neck:  Mild fullness rt supraclavicular area c/w adenopathy  Cardiovascular: Normal rate and regular rhythm.   Respiratory: Effort normal.  Dim BS bases with few bibasilar crackles  GI: Soft. Bowel sounds are normal. There is no tenderness.  Musculoskeletal: Normal range of motion. He exhibits no edema.  Neurological: He is alert and oriented to person, place, and time.     Assessment/Plan Pt with past hx of smoking, pulmonary fibrosis ; now with lung/liver/bone lesions and associated adenopathy. Plan is for US guided right supraclavicular lymph node biopsy today. Details/risks of procedure d/w pt/wife with their understanding and consent.  Zamaria Brazzle,D KEVIN 11/15/2012, 2:50 PM

## 2012-11-21 ENCOUNTER — Telehealth: Payer: Self-pay | Admitting: Internal Medicine

## 2012-11-21 NOTE — Telephone Encounter (Signed)
C/D 11/21/12 for appt. 11/22/12 °

## 2012-11-22 ENCOUNTER — Encounter: Payer: Self-pay | Admitting: Internal Medicine

## 2012-11-22 ENCOUNTER — Telehealth: Payer: Self-pay | Admitting: Internal Medicine

## 2012-11-22 ENCOUNTER — Encounter: Payer: Self-pay | Admitting: *Deleted

## 2012-11-22 ENCOUNTER — Ambulatory Visit
Admission: RE | Admit: 2012-11-22 | Discharge: 2012-11-22 | Disposition: A | Discharge status: Home / Self Care | Payer: Medicare Other | Source: Ambulatory Visit | Attending: Radiation Oncology | Admitting: Radiation Oncology

## 2012-11-22 ENCOUNTER — Ambulatory Visit (HOSPITAL_BASED_OUTPATIENT_CLINIC_OR_DEPARTMENT_OTHER): Payer: Medicare Other | Admitting: Internal Medicine

## 2012-11-22 ENCOUNTER — Ambulatory Visit: Payer: Self-pay | Admitting: Internal Medicine

## 2012-11-22 VITALS — BP 149/96 | HR 99 | Temp 97.0°F | Resp 20 | Ht 69.0 in | Wt 190.9 lb

## 2012-11-22 DIAGNOSIS — C3490 Malignant neoplasm of unspecified part of unspecified bronchus or lung: Secondary | ICD-10-CM

## 2012-11-22 DIAGNOSIS — C787 Secondary malignant neoplasm of liver and intrahepatic bile duct: Secondary | ICD-10-CM

## 2012-11-22 DIAGNOSIS — C7952 Secondary malignant neoplasm of bone marrow: Secondary | ICD-10-CM

## 2012-11-22 DIAGNOSIS — R0602 Shortness of breath: Secondary | ICD-10-CM

## 2012-11-22 DIAGNOSIS — F411 Generalized anxiety disorder: Secondary | ICD-10-CM

## 2012-11-22 DIAGNOSIS — C3410 Malignant neoplasm of upper lobe, unspecified bronchus or lung: Secondary | ICD-10-CM

## 2012-11-22 DIAGNOSIS — C7951 Secondary malignant neoplasm of bone: Secondary | ICD-10-CM

## 2012-11-22 MED ORDER — LIDOCAINE-PRILOCAINE 2.5-2.5 % EX CREA: TOPICAL_CREAM | CUTANEOUS | Status: DC | PRN

## 2012-11-22 MED ORDER — PROCHLORPERAZINE MALEATE 10 MG PO TABS: 10.0000 mg | ORAL_TABLET | Freq: Four times a day (QID) | ORAL | Status: DC | PRN

## 2012-11-22 NOTE — Progress Notes (Addendum)
[facility] Clinical Social Work  Clinical Social Work briefly met with patient and spouse at [facility] for assessment of psychosocial needs. [name] scored a 5 on the distress thermometer mainly indicating physical problems.  Patient and spouse stated they felt comfortable with plan but had difficulty understanding.  CSW will send request to thoracic navigator to follow up with patient and spouse.  CSW encouraged patient to contact CSW with any questions or concerns.    Kathrin Penner, MSW, LCSW Clinical Social Worker [facility] [phone]

## 2012-11-22 NOTE — Telephone Encounter (Signed)
gv pt appt schedule for July/August and appt for port placement via IR 7/10 to arrive 12:30pm @ [facility]. [facility] will contact pt re mri. Pt aware. Location for mri listed as [facility]. Per pt/wife it would be more convenient for pt to have wkly lb draw @ [facility]. inbasket to MM and copied to desk nurses and Annabelle Harman re getting pt set up for wkly lb draw @ [facility]. Pt/wife aware.

## 2012-11-22 NOTE — Progress Notes (Signed)
[facility] [phone]   Fax:([phone]) [phone]  CONSULT NOTE  REFERRING PHYSICIAN: Dr. Isabel Caprice  REASON FOR CONSULTATION:  79 years old white male recently diagnosed with lung cancer.  HPI Phillip [name] is a 79 y.o. male was past medical history significant for hypertension, pulmonary fibrosis, diabetes mellitus, GERD, arthritis, obstructive sleep apnea as well as remote history of smoking. The patient was seen by his primary care physician few weeks ago complaining of abdominal pain and ultrasound of the abdomen was performed and it showed suspicious liver lesion. He was seen by Dr. Cleone Slim and CT scan of the chest, abdomen and pelvis were performed and showed abnormalities in his lung as well as liver metastasis. Unfortunately I don't have the results of these scans. The patient also had a PET scan performed on 11/15/2012 and it showed There is a pulmonary nodule within the left upper lobe measuring 1.3 cm. This has an SUV max equal to 16. Right upper lobe perihilar mass is identified measuring 3 cm. This has an SUV max equal to 15.30. Hypermetabolic right hilar and right paratracheal lymph nodes are identified. Index right paratracheal lymph node measures 1.5 cm and has an SUV max equal to 25.6. At the thoracic inlet there are two hypermetabolic right supraclavicular lymph nodes. The largest measures 1 cm and has an SUV max equal to 19. There are multi focal hypermetabolic liver lesions identified within both lobes. Left hepatic lobe lesion measures 2.2 cm and has an SUV max equal to 13.1. There are hypermetabolic portocaval and porta hepatic lymph nodes identified. The portacaval node measures 2 cm and has an SUV max equal to 18.30. There is a hypermetabolic bone lesion within the L1 vertebra. The SUV max associated with this lesion is equal to 8.6. On 11/15/2012 the patient underwent ultrasound-guided right supraclavicular lymph node biopsy by interventional radiology.  The final pathology (Accession: [id number]) showed metastatic carcinoma with neuroendocrine features. The malignant cells are positive for CD56 and cytokeratin 7. They are negative for TTF1 and cytokeratin 5/6. The findings are consistent with metastatic poorly differentiated carcinoma. There are some focal features suggesting small cell differentiation, at least focally. The patient was referred to me today for further evaluation and recommendation regarding treatment of his condition. He is feeling fine today except for anxiety as well as shortness of breath and cough productive of yellowish sputum. The patient denied having any chest pain or hemoptysis. He has occasional rectal bleeding. He also complains of weight loss around 10 pounds in the last 3 months as well as night sweats. He denied having any significant headache or visual changes.  His family history significant for mother and brother died from lung cancer and father died from heart disease. The patient is married and has 1 daughter. He lives in [place]. He was accompanied by his wife Phillip [name]. He is currently retired and used to work for [employer] as a Merchandiser, retail. He has a history of smoking for around 20 years but quit 42 years ago. No history of alcohol or drug abuse.  @SFHPI @  Past Medical History  Diagnosis Date  . Hypertension   . Pulmonary fibrosis 2010  . Sleep apnea   . Diabetes mellitus without complication   . GERD (gastroesophageal reflux disease)   . Arthritis     Past Surgical History  Procedure Laterality Date  . Prostate surgery      Patient wife states they rimmed out prostate    No family history on  file.  Social History History  Substance Use Topics  . Smoking status: Former Smoker -- 1.00 packs/day for 20 years    Types: Cigarettes, Pipe    Quit date: 11/15/1973  . Smokeless tobacco: Never Used  . Alcohol Use: No    No Known Allergies  Current Outpatient Prescriptions  Medication Sig Dispense Refill    . allopurinol (ZYLOPRIM) 100 MG tablet Take 200 mg by mouth daily.      Marland Kitchen aspirin 81 MG tablet Take 81 mg by mouth daily.      . bumetanide (BUMEX) 1 MG tablet Take 1 mg by mouth daily.      . ciprofloxacin (CIPRO) 500 MG tablet       . glucosamine-chondroitin 500-400 MG tablet Take 1 tablet by mouth 2 (two) times daily.      Marland Kitchen HYDROcodone-acetaminophen (NORCO) 10-325 MG per tablet Take 1 tablet by mouth every 6 (six) hours as needed for pain.      . Insulin Isophane & Regular (NOVOLIN 70/30 RELION [place]) Inject 55 Units into the skin 2 (two) times daily.      Marland Kitchen losartan (COZAAR) 100 MG tablet Take 100 mg by mouth daily.      . Magnesium-Zinc (MAGNESIUM-CHELATED ZINC PO) Take 1 tablet by mouth 3 (three) times daily.      . Multiple Vitamin (MULTIVITAMIN WITH MINERALS) TABS Take 1 tablet by mouth daily.      . traZODone (DESYREL) 50 MG tablet       . colchicine 0.6 MG tablet Take 0.6 mg by mouth 2 (two) times daily as needed (for gout).       No current facility-administered medications for this visit.    Review of Systems  A comprehensive review of systems was negative except for: Constitutional: positive for fatigue and weight loss Respiratory: positive for cough, dyspnea on exertion and sputum Behavioral/Psych: positive for anxiety  Physical Exam  ZOX:WRUEA, healthy, no distress, well nourished and well developed SKIN: skin color, texture, turgor are normal HEAD: Normocephalic, No masses, lesions, tenderness or abnormalities EYES: normal, PERRLA EARS: External ears normal OROPHARYNX:no exudate and no erythema  NECK: supple, no adenopathy LYMPH:  no palpable lymphadenopathy, no hepatosplenomegaly BREAST:breasts appear normal, no suspicious masses, no skin or nipple changes or axillary nodes LUNGS: expiratory wheezes bilaterally HEART: regular rate & rhythm, no murmurs and no gallops ABDOMEN:abdomen soft, non-tender, normal bowel sounds and no masses or organomegaly BACK: Back  symmetric, no curvature. EXTREMITIES:no joint deformities, effusion, or inflammation, no edema, no skin discoloration, no clubbing  NEURO: alert & oriented x 3 with fluent speech, no focal motor/sensory deficits  PERFORMANCE STATUS: ECOG 1-2  LABORATORY DATA: Lab Results  Component Value Date   WBC 7.1 04/08/2009   HGB 10.9* 04/08/2009   HCT 32.8* 04/08/2009   MCV 95.8 04/08/2009   PLT 270.0 04/08/2009      Chemistry      Component Value Date/Time   NA 142 04/08/2009 0957   K 4.5 04/08/2009 0957   CL 107 04/08/2009 0957   CO2 26 04/08/2009 0957   BUN 21 04/08/2009 0957   CREATININE 1.7* 04/08/2009 0957      Component Value Date/Time   CALCIUM 9.1 04/08/2009 0957       RADIOGRAPHIC STUDIES: Nm Pet Image Initial (pi) Skull Base To Thigh  11/15/2012   *RADIOLOGY REPORT*  Clinical Data: Initial treatment strategy for lung cancer.  NUCLEAR MEDICINE PET SKULL BASE TO THIGH  Fasting Blood Glucose:  104  Technique:  16.4 mCi F-18 FDG was injected intravenously. CT data was obtained and used for attenuation correction and anatomic localization only.  (This was not acquired as a diagnostic CT examination.) Additional exam technical data entered on technologist worksheet.  Comparison:  11/07/2012  Findings:  Neck: No hypermetabolic lymph nodes in the neck.  Chest:  Small bilateral pleural effusions are identified.  Moderate changes of centrilobular emphysema.  There is a pulmonary nodule within the left upper lobe measuring 1.3 cm, image 62.  This has an SUV max equal to 16.  Right upper lobe perihilar mass is identified measuring 3 cm, image 69.  This has an SUV max equal to 15.30, image 69.  Hypermetabolic right hilar and right paratracheal lymph nodes are identified. Index right paratracheal lymph node measures 1.5 cm and has an SUV max equal to 25.6, image 68.  At the thoracic inlet there are two hypermetabolic right supraclavicular lymph nodes.  The largest measures 1 cm and has an SUV  max equal to 19, image 39.  Abdomen/Pelvis:  There are multi focal hypermetabolic liver lesions identified within both lobes.  Left hepatic lobe lesion measures 2.2 cm and has an SUV max equal to 13.1, image 106.  There are multiple stones within the gallbladder.  Pancreas is normal.  The spleen measures 17 cm in craniocaudal dimension.  No focal splenic lesions identified.  The adrenal glands are normal.  Increased FDG uptake within the prostate gland is noted, nonspecific.  There are hypermetabolic portocaval and porta hepatic lymph nodes identified.  The portacaval node measures 2 cm and has an SUV max equal to 18.30.  Skeleton:  There is a hypermetabolic bone lesion within the L1 vertebra.  The SUV max associated with this lesion is equal to 8.6.  IMPRESSION:  1.  Bilateral pulmonary lesions are hypermetabolic consistent with lung cancer. 2.  Multiple hypermetabolic right hilar, right paratracheal and right supraclavicular lymph nodes consistent with metastatic adenopathy. 3.  Multi focal hypermetabolic liver lesions. 4. Hypermetabolic porta hepatic and portacaval adenopathy. 5.  Hypermetabolic bone metastasis to the lumbar spine. 6.  Splenomegaly.   Original Report Authenticated By: Signa Kell, M.D.   US Biopsy  11/15/2012   *RADIOLOGY REPORT*  Indication: History of metastatic lung cancer, now with hypermetabolic right supraclavicular lymphadenopathy  ULTRASOUND GUIDED RIGHT SUPRACLAVICULAR LYMPH NODE BIOPSY  Comparisons: PET CT - earlier same day  Medications: Fentanyl 100 mcg IV; Versed 2 mg IV  Total Moderate Sedation Time: 10 minutes  Complications: None immediate  Findings / Technique:  Informed written consent was obtained from the patient after a discussion of the risks, benefits and alternatives to treatment. Questions regarding the procedure were encouraged and answered. Initial ultrasound scanning demonstrated an approximately 1.8 x 1.3 x 2.1 cm enlarged abnormal appearing right supraclavicular  lymph node correlating with the hypermetabolic lymph nodes seen on preceding PET-CT.  An ultrasound image was saved for documentation purposes.  The procedure was planned.  A timeout was performed prior to the initiation of the procedure.  The operative was prepped and draped in the usual sterile fashion, and a sterile drape was applied covering the operative field.  A timeout was performed prior to the initiation of the procedure. Local anesthesia was provided with 1% lidocaine with epinephrine.  Under direct ultrasound guidance, an 18 gauge core needle device was utilized to obtain to obtain 5 core needle biopsied of the enlarged right supraclavicular lymph node.  The samples were placed in saline and submitted to pathology.  The needle was removed and hemostasis was achieved with manual compression.  Post procedure scan was negative for significant hematoma.  A dressing was placed.  The patient tolerated the procedure well without immediate postprocedural complication.  Impression:  Successful ultrasound guided right supraclavicular lymph node biopsy.   Original Report Authenticated By: Tacey Ruiz, MD    ASSESSMENT: This is a very pleasant 79 years old white male recently diagnosed with extensive stage small cell lung cancer with bilateral pulmonary nodules, mediastinal and supraclavicular lymphadenopathy as well as liver and bone metastases.    PLAN: I have a lengthy discussion with the patient and his wife today about his disease stage, prognosis and treatment options. I showed them the images of the PET scan. I will complete the staging workup by ordering MRI of the brain to rule out brain metastases. I discussed with the patient his treatment options including palliative care and hospice versus systemic chemotherapy with carboplatin and etoposide. The patient is interested in chemotherapy and I would consider him for treatment with carboplatin for AUC of 5 on day 1 and etoposide 100 mg/M2 on days  1, 2 and 3 with Neulasta support on day 4. I discussed with the patient the adverse effects of the chemotherapy including but not limited to alopecia, myelosuppression, nausea and vomiting, peripheral neuropathy, liver or renal dysfunction. I will arrange for the patient to have a chemotherapy education class before starting the first cycle of his chemotherapy. I would also referred the patient to interventional radiology for consideration of Port-A-Cath placement before the first cycle of his treatment. He is expected to start the first cycle of chemotherapy on 12/03/2012. I would see him back for followup visit at that time. I will call his pharmacy with prescription for Compazine 10 mg by mouth every 6 hours as needed for nausea in addition to Emla Cream to be applied to the Port-A-Cath site before chemotherapy. I gave the patient and his wife the time to ask questions and I answered them completely to their satisfaction.  He was advised to call immediately if he has any concerning symptoms in the interval.   All questions were answered. The patient knows to call the clinic with any problems, questions or concerns. We can certainly see the patient much sooner if necessary.  Thank you so much for allowing me to participate in the care of KEVON TENCH. I will continue to follow up the patient with you and assist in his care.  I spent 45 minutes counseling the patient face to face. The total time spent in the appointment was 60 minutes.  Aiven Kampe K. 11/22/2012, 2:46 PM

## 2012-11-22 NOTE — Patient Instructions (Signed)
You are recently diagnosed with extensive stage small cell lung cancer. We discussed treatment options including systemic chemotherapy with carboplatin and etoposide. I will complete the staging workup by ordering an MRI of the brain. First cycle of chemotherapy expected on 12/03/2012. Followup visit at that time

## 2012-11-26 ENCOUNTER — Encounter: Payer: Self-pay | Admitting: Internal Medicine

## 2012-11-26 ENCOUNTER — Other Ambulatory Visit: Payer: Self-pay | Admitting: Medical Oncology

## 2012-11-26 ENCOUNTER — Ambulatory Visit (INDEPENDENT_AMBULATORY_CARE_PROVIDER_SITE_OTHER): Payer: Medicare Other | Admitting: Internal Medicine

## 2012-11-26 ENCOUNTER — Telehealth: Payer: Self-pay | Admitting: Medical Oncology

## 2012-11-26 ENCOUNTER — Telehealth: Payer: Self-pay | Admitting: Internal Medicine

## 2012-11-26 VITALS — BP 140/80 | HR 82 | Temp 97.9°F | Ht 69.5 in | Wt 194.0 lb

## 2012-11-26 DIAGNOSIS — J8410 Pulmonary fibrosis, unspecified: Secondary | ICD-10-CM

## 2012-11-26 DIAGNOSIS — J9610 Chronic respiratory failure, unspecified whether with hypoxia or hypercapnia: Secondary | ICD-10-CM

## 2012-11-26 NOTE — Telephone Encounter (Signed)
Pt wants weekly labs drawn in [place] at [facility]. Lab order sent to dr Arbutus Ped for Berkley Harvey.

## 2012-11-26 NOTE — Telephone Encounter (Signed)
pt want to know if inj and lab only could be done in [place]...per email from Dr. Arbutus Ped to [facility] he will handle at nxt visit

## 2012-11-26 NOTE — Patient Instructions (Addendum)
Goal is maintain 02 sat over 90 with walking   Treatment consists of avoiding foods that cause gas (especially beans and raw vegetables like spinach and salads and boiled eggs)  and citrucel 1 heaping tsp twice daily with a large glass of water.  Pain should improve w/in 2 weeks and if not then consider further GI work up.   Please schedule a follow up office visit in 6 weeks, call sooner if needed with pfts

## 2012-11-26 NOTE — Progress Notes (Signed)
Subjective:     Patient ID: Phillip [name], male   DOB: [birth year]  MRN: [id number]  HPI   49 yowm quit smoking in 1972 with pulmonary fibrosis by chest x-ray the dating back to at least June of 2007 corresponding to complaints of mild dyspnea on exertion.   PFTs dated November 2008 indicated diffusing capacity of only 38% predicted. However, there was really no significant overall change in the chest x-ray pattern that dating back to June of 2007. The patient already qualified for both nocturnal bipap ([place]) and oxygen with exercise although he admits he does not use oxygen with exercise (doesn't feel it helps).     April 08, 2009 ov Acute visit. Pt states that his breathing has progressively worsened over the past 6 months. He states that he gets SOB just walking from room to room. Also c/o cough worsening. Sometimes cough is prod with clear sputum and worse after eats, now Uses [facility] parking rec   April 27, 2010 ov cc doe better to point where can go walk around large store wihout 02 using 4 pronged walker and no cough, no noct co's.   11/26/2012 f/u ov/Rolen Conger  Chief Complaint  Patient presents with  . Follow-up    Pt c/o increased DOE for the past 6 months. He states gets OOB walking no more than 100 ft. He also c/o increased cough for the past 3 months- prod with moderate yellow sputum.    assoc with painful gas, has not tried walking on 02   No obvious daytime variabilty or assoc  cp or chest tightness, subjective wheeze overt sinus or hb symptoms. No unusual exp hx or h/o childhood pna/ asthma or knowledge of premature birth.   Current Medications, Allergies, Past Medical History, Past Surgical History, Family History, and Social History were reviewed in [employer] record.  ROS  The following are not active complaints unless bolded sore throat, dysphagia, dental problems, itching, sneezing,  nasal congestion or excess/ purulent secretions, ear ache,   fever,  chills, sweats, unintended wt loss, pleuritic or exertional cp, hemoptysis,  orthopnea pnd or leg swelling, presyncope, palpitations, heartburn, abdominal pain, anorexia, nausea, vomiting, diarrhea  or change in bowel or urinary habits, change in stools or urine, dysuria,hematuria,  rash, arthralgias, visual complaints, headache, numbness weakness or ataxia or problems with walking or coordination,  change in mood/affect or memory.           Past Medical History:  OBESITY, MORBID (ICD-278.01)  PULMONARY FIBROSIS ILD POST INFLAMMATORY CHRONIC (ICD-515).................Marland KitchenWert  - PFTs 11//4/08 vital capacity 75% diffusing capacity 38%  11/27/07 vital capacity 72% diffusing capacity 41%  04/30/09 Vital capacity 71%diffusing capacity 41%  TUBERCULOSIS EXPOSURE (ICD-V01.1)  SLEEP APNEA (ICD-780.57)  DM (ICD-250.00)  HYPERTENSION (ICD-401.9)          Review of Systems     Objective:   Physical Exam    Ambulatory healthy appearing obese white male in no acute distress.  240 > 233 > 214 April 27, 2010  Wt Readings from Last 3 Encounters:  11/26/12 194 lb (87.998 kg)  11/22/12 190 lb 14.4 oz (86.592 kg)  11/15/12 198 lb (89.812 kg)    HEENT: nl dentition, turbinates, and orophanx. Nl external ear canals without cough reflex  Neck without JVD/Nodes/TM  Lungs without cough on insp or exp maneuvers, minimal crackles in bases on deep inspiration  RRR no s3 or murmur or increase in P2 or edema  Abd soft and benign with nl  excursion in the supine position. No bruits or organomegaly  Ext warm without calf tenderness, cyanosis clubbing  Skin warm and dry without lesions  Assessment:

## 2012-11-27 ENCOUNTER — Telehealth: Payer: Self-pay | Admitting: Internal Medicine

## 2012-11-27 ENCOUNTER — Other Ambulatory Visit: Payer: Self-pay | Admitting: Radiology

## 2012-11-27 ENCOUNTER — Telehealth: Payer: Self-pay | Admitting: Medical Oncology

## 2012-11-27 ENCOUNTER — Telehealth: Payer: Self-pay | Admitting: *Deleted

## 2012-11-27 ENCOUNTER — Encounter: Payer: Self-pay | Admitting: *Deleted

## 2012-11-27 ENCOUNTER — Other Ambulatory Visit: Payer: Medicare Other

## 2012-11-27 MED ORDER — SODIUM CHLORIDE 0.9 % IV SOLN: INTRAVENOUS | Status: DC

## 2012-11-27 MED ORDER — DEXTROSE 5 % IV SOLN: 2.0000 g | Freq: Once | INTRAVENOUS | Status: DC

## 2012-11-27 NOTE — Telephone Encounter (Signed)
Social worker asked if I could call patient regarding lung cancer information.  I called and left vm message with name and phone number to call with any questions or concerns I could help with.

## 2012-11-27 NOTE — Telephone Encounter (Signed)
Gave pt appt for lab, Md and chemo for June and August 2014

## 2012-11-27 NOTE — Telephone Encounter (Signed)
Requests appetite stimulant-pt starts chemo next Monday.Message to Dr Arbutus Ped.

## 2012-11-27 NOTE — Telephone Encounter (Signed)
Received vm from [place] in central that pt needs lb prior to 7/11 mri. R/s 7/14 lb to 7/11 - ok per desk nurse - lb 7/14 will not be needed if drawn 7/11. lmonvm for pt re lb for 7/11 @ 11am and also confirmed 12pm mri. Also lm re change in appts for 7/14 (no lb) and gv new time for 12pm.

## 2012-11-28 ENCOUNTER — Other Ambulatory Visit: Payer: Self-pay | Admitting: *Deleted

## 2012-11-28 ENCOUNTER — Encounter: Payer: Self-pay | Admitting: *Deleted

## 2012-11-28 ENCOUNTER — Telehealth: Payer: Self-pay | Admitting: Medical Oncology

## 2012-11-28 ENCOUNTER — Other Ambulatory Visit: Payer: Self-pay | Admitting: Medical Oncology

## 2012-11-28 ENCOUNTER — Encounter (HOSPITAL_COMMUNITY): Payer: Self-pay | Admitting: Pharmacy Technician

## 2012-11-28 DIAGNOSIS — R630 Anorexia: Secondary | ICD-10-CM

## 2012-11-28 MED ORDER — METHYLPREDNISOLONE 4 MG PO KIT: PACK | ORAL | Status: DC

## 2012-11-28 NOTE — Telephone Encounter (Signed)
Port cannot be placed tomorrow due to tooth abcess. Procedure cancelled and IR will call pt . Per Dr. Arbutus Ped use PIV for chemo on Monday.

## 2012-11-28 NOTE — Telephone Encounter (Signed)
Message copied by Charma Igo on Wed Nov 28, 2012  1:03 PM ------      Message from: Si Gaul      Created: Tue Nov 27, 2012  8:16 PM       Medrol dose pak      ----- Message -----         From: Charma Igo, RN         Sent: 11/27/2012   3:14 PM           To: Si Gaul, MD            Wife is requesting appetite stimulant for [place]. He starts chemo on Monday.       ------

## 2012-11-28 NOTE — Telephone Encounter (Signed)
I faxed lab orders to [facility] hospital

## 2012-11-28 NOTE — Telephone Encounter (Signed)
caleld in medrol dose pack to pharmacy adn to pt.

## 2012-11-29 ENCOUNTER — Telehealth (HOSPITAL_COMMUNITY): Payer: Self-pay | Admitting: Radiology

## 2012-11-29 ENCOUNTER — Other Ambulatory Visit (HOSPITAL_COMMUNITY): Payer: Medicare Other

## 2012-11-29 ENCOUNTER — Ambulatory Visit (HOSPITAL_COMMUNITY): Admission: RE | Admit: 2012-11-29 | Payer: Medicare Other | Source: Ambulatory Visit

## 2012-11-29 DIAGNOSIS — J9610 Chronic respiratory failure, unspecified whether with hypoxia or hypercapnia: Secondary | ICD-10-CM | POA: Insufficient documentation

## 2012-11-29 NOTE — Telephone Encounter (Signed)
Received message from Short Stay at 1600 11-28-12, that patient's wife called concerned that the newly diagnosed dental abscess and rx for atx would affect his portacath placement scheduled for 11-29-12.  I spoke with Dr. Fredia Sorrow who requested that we reschedule the portacath placement once the abscess has resolved.  I called the patient's wife back and let her know that we would need to postpone the port.  She was concerned about how the patient would receive his scheduled chemo next week.  I let her know that we would call Dr. Asa Lente office to let him know, and that Dr. Arbutus Ped would decide how to continue.  Misty Stanley, RN called patient's wife back after speaking to Dr. Asa Lente RN.  We are to cancel port order, a new order will be placed at a later date and Dr. Arbutus Ped would not like a PICC in the meantime.

## 2012-11-29 NOTE — Assessment & Plan Note (Addendum)
DDx for pulmonary fibrosis  includes idiopathic pulmonary fibrosis, pulmonary fibrosis associated with rheumatologic diseases (which have a relatively benign course in most cases) , adverse effect from  drugs such as chemotherapy or amiodarone exposure, nonspecific interstitial pneumonia which is typically steroid responsive, and chronic hypersensitivity pneumonitis.   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

## 2012-11-29 NOTE — Assessment & Plan Note (Signed)
Adequate control on present rx, reviewed > rx with 02 to maintain sat > 90%

## 2012-11-30 ENCOUNTER — Encounter: Payer: Self-pay | Admitting: *Deleted

## 2012-11-30 ENCOUNTER — Ambulatory Visit (HOSPITAL_COMMUNITY)
Admission: RE | Admit: 2012-11-30 | Discharge: 2012-11-30 | Disposition: A | Discharge status: Home / Self Care | Payer: Medicare Other | Source: Ambulatory Visit | Attending: Internal Medicine | Admitting: Internal Medicine

## 2012-11-30 ENCOUNTER — Other Ambulatory Visit: Payer: Self-pay | Admitting: Medical Oncology

## 2012-11-30 ENCOUNTER — Telehealth: Payer: Self-pay | Admitting: Medical Oncology

## 2012-11-30 ENCOUNTER — Other Ambulatory Visit: Payer: Medicare Other

## 2012-11-30 DIAGNOSIS — R269 Unspecified abnormalities of gait and mobility: Secondary | ICD-10-CM | POA: Insufficient documentation

## 2012-11-30 DIAGNOSIS — C3490 Malignant neoplasm of unspecified part of unspecified bronchus or lung: Secondary | ICD-10-CM | POA: Insufficient documentation

## 2012-11-30 LAB — CREATININE, SERUM: Creatinine, Ser: 1.31 mg/dL (ref 0.50–1.35)

## 2012-11-30 MED ORDER — GADOBENATE DIMEGLUMINE 529 MG/ML IV SOLN
18.0000 mL | Freq: Once | INTRAVENOUS | Status: AC | PRN
  Administered 2012-11-30: 18 mL via INTRAVENOUS

## 2012-11-30 NOTE — Telephone Encounter (Signed)
This phone number , but no message, was  left on my phone. I called Phillip [name] back and she asked if labs could be done at [place]. I told her I faxed an order to [facility] on wed to call pt and draw labs on 7/21 and 7/28. She then asked if shot could be given in [place] too. I told her I did not make those arrangements and to talk to Dr Arbutus Ped about this on Monday .

## 2012-12-03 ENCOUNTER — Telehealth: Payer: Self-pay | Admitting: *Deleted

## 2012-12-03 ENCOUNTER — Inpatient Hospital Stay (HOSPITAL_COMMUNITY)
Admission: AD | Admit: 2012-12-03 | Discharge: 2012-12-08 | DRG: 180 | Disposition: A | Discharge status: Home / Self Care | Payer: Medicare Other | Source: Other Acute Inpatient Hospital | Attending: Internal Medicine | Admitting: Internal Medicine

## 2012-12-03 ENCOUNTER — Ambulatory Visit: Payer: Medicare Other

## 2012-12-03 ENCOUNTER — Other Ambulatory Visit: Payer: Medicare Other | Admitting: Lab

## 2012-12-03 ENCOUNTER — Ambulatory Visit: Payer: Medicare Other | Admitting: Internal Medicine

## 2012-12-03 ENCOUNTER — Encounter (HOSPITAL_COMMUNITY): Payer: Self-pay | Admitting: *Deleted

## 2012-12-03 DIAGNOSIS — C787 Secondary malignant neoplasm of liver and intrahepatic bile duct: Secondary | ICD-10-CM | POA: Diagnosis present

## 2012-12-03 DIAGNOSIS — Z6827 Body mass index (BMI) 27.0-27.9, adult: Secondary | ICD-10-CM

## 2012-12-03 DIAGNOSIS — R0602 Shortness of breath: Secondary | ICD-10-CM

## 2012-12-03 DIAGNOSIS — R599 Enlarged lymph nodes, unspecified: Secondary | ICD-10-CM | POA: Diagnosis present

## 2012-12-03 DIAGNOSIS — Z87891 Personal history of nicotine dependence: Secondary | ICD-10-CM

## 2012-12-03 DIAGNOSIS — D638 Anemia in other chronic diseases classified elsewhere: Secondary | ICD-10-CM | POA: Diagnosis present

## 2012-12-03 DIAGNOSIS — T451X5A Adverse effect of antineoplastic and immunosuppressive drugs, initial encounter: Secondary | ICD-10-CM | POA: Diagnosis present

## 2012-12-03 DIAGNOSIS — F411 Generalized anxiety disorder: Secondary | ICD-10-CM | POA: Diagnosis present

## 2012-12-03 DIAGNOSIS — Z7982 Long term (current) use of aspirin: Secondary | ICD-10-CM

## 2012-12-03 DIAGNOSIS — R7401 Elevation of levels of liver transaminase levels: Secondary | ICD-10-CM | POA: Diagnosis present

## 2012-12-03 DIAGNOSIS — R748 Abnormal levels of other serum enzymes: Secondary | ICD-10-CM

## 2012-12-03 DIAGNOSIS — E119 Type 2 diabetes mellitus without complications: Secondary | ICD-10-CM | POA: Diagnosis present

## 2012-12-03 DIAGNOSIS — J9610 Chronic respiratory failure, unspecified whether with hypoxia or hypercapnia: Secondary | ICD-10-CM

## 2012-12-03 DIAGNOSIS — C3492 Malignant neoplasm of unspecified part of left bronchus or lung: Secondary | ICD-10-CM

## 2012-12-03 DIAGNOSIS — C778 Secondary and unspecified malignant neoplasm of lymph nodes of multiple regions: Secondary | ICD-10-CM | POA: Diagnosis present

## 2012-12-03 DIAGNOSIS — J8410 Pulmonary fibrosis, unspecified: Secondary | ICD-10-CM | POA: Diagnosis present

## 2012-12-03 DIAGNOSIS — G4730 Sleep apnea, unspecified: Secondary | ICD-10-CM | POA: Diagnosis present

## 2012-12-03 DIAGNOSIS — K219 Gastro-esophageal reflux disease without esophagitis: Secondary | ICD-10-CM | POA: Diagnosis present

## 2012-12-03 DIAGNOSIS — D6959 Other secondary thrombocytopenia: Secondary | ICD-10-CM | POA: Diagnosis present

## 2012-12-03 DIAGNOSIS — M129 Arthropathy, unspecified: Secondary | ICD-10-CM | POA: Diagnosis present

## 2012-12-03 DIAGNOSIS — R161 Splenomegaly, not elsewhere classified: Secondary | ICD-10-CM | POA: Diagnosis present

## 2012-12-03 DIAGNOSIS — Z794 Long term (current) use of insulin: Secondary | ICD-10-CM

## 2012-12-03 DIAGNOSIS — C7951 Secondary malignant neoplasm of bone: Secondary | ICD-10-CM | POA: Diagnosis present

## 2012-12-03 DIAGNOSIS — E43 Unspecified severe protein-calorie malnutrition: Secondary | ICD-10-CM | POA: Diagnosis present

## 2012-12-03 DIAGNOSIS — N259 Disorder resulting from impaired renal tubular function, unspecified: Secondary | ICD-10-CM

## 2012-12-03 DIAGNOSIS — R05 Cough: Secondary | ICD-10-CM

## 2012-12-03 DIAGNOSIS — R7402 Elevation of levels of lactic acid dehydrogenase (LDH): Secondary | ICD-10-CM | POA: Diagnosis present

## 2012-12-03 DIAGNOSIS — C3490 Malignant neoplasm of unspecified part of unspecified bronchus or lung: Principal | ICD-10-CM | POA: Diagnosis present

## 2012-12-03 DIAGNOSIS — I10 Essential (primary) hypertension: Secondary | ICD-10-CM | POA: Diagnosis present

## 2012-12-03 DIAGNOSIS — E871 Hypo-osmolality and hyponatremia: Secondary | ICD-10-CM | POA: Diagnosis present

## 2012-12-03 DIAGNOSIS — Z79899 Other long term (current) drug therapy: Secondary | ICD-10-CM

## 2012-12-03 HISTORY — DX: Anxiety disorder, unspecified: F41.9

## 2012-12-03 LAB — PROTIME-INR: Prothrombin Time: 14.6 seconds (ref 11.6–15.2)

## 2012-12-03 LAB — MAGNESIUM: Magnesium: 1.9 mg/dL (ref 1.5–2.5)

## 2012-12-03 LAB — COMPREHENSIVE METABOLIC PANEL
BUN: 17 mg/dL (ref 6–23)
Calcium: 8.8 mg/dL (ref 8.4–10.5)
GFR calc Af Amer: 89 mL/min — ABNORMAL LOW (ref >90)
GFR calc non Af Amer: 77 mL/min — ABNORMAL LOW (ref >90)
Glucose, Bld: 196 mg/dL — ABNORMAL HIGH (ref 70–99)
Total Protein: 8.1 g/dL (ref 6.0–8.3)

## 2012-12-03 LAB — CBC
MCH: 33 pg (ref 26.0–34.0)
Platelets: 147 10*3/uL — ABNORMAL LOW (ref 150–400)
RBC: 4.79 MIL/uL (ref 4.22–5.81)
RDW: 16.1 % — ABNORMAL HIGH (ref 11.5–15.5)
WBC: 10.6 10*3/uL — ABNORMAL HIGH (ref 4.0–10.5)

## 2012-12-03 MED ORDER — HYDROCODONE-ACETAMINOPHEN 10-325 MG PO TABS
1.0000 | ORAL_TABLET | Freq: Four times a day (QID) | ORAL | Status: DC | PRN
  Administered 2012-12-03 – 2012-12-07 (×5): 1 via ORAL
  Filled 2012-12-03 (×5): qty 1

## 2012-12-03 MED ORDER — MORPHINE SULFATE 2 MG/ML IJ SOLN
INTRAMUSCULAR | Status: AC
  Filled 2012-12-03: qty 1

## 2012-12-03 MED ORDER — ALBUTEROL SULFATE (5 MG/ML) 0.5% IN NEBU: 2.5000 mg | INHALATION_SOLUTION | Freq: Four times a day (QID) | RESPIRATORY_TRACT | Status: DC | PRN

## 2012-12-03 MED ORDER — GLUCOSAMINE-CHONDROITIN 500-400 MG PO TABS: 1.0000 | ORAL_TABLET | Freq: Two times a day (BID) | ORAL | Status: DC

## 2012-12-03 MED ORDER — INSULIN ASPART 100 UNIT/ML ~~LOC~~ SOLN
0.0000 [IU] | Freq: Three times a day (TID) | SUBCUTANEOUS | Status: DC
  Administered 2012-12-04: 5 [IU] via SUBCUTANEOUS
  Administered 2012-12-04: 3 [IU] via SUBCUTANEOUS
  Administered 2012-12-04: 5 [IU] via SUBCUTANEOUS
  Administered 2012-12-05: 3 [IU] via SUBCUTANEOUS
  Administered 2012-12-05: 15 [IU] via SUBCUTANEOUS
  Administered 2012-12-06 (×2): 5 [IU] via SUBCUTANEOUS
  Administered 2012-12-06: 3 [IU] via SUBCUTANEOUS
  Administered 2012-12-07: 8 [IU] via SUBCUTANEOUS
  Administered 2012-12-07: 3 [IU] via SUBCUTANEOUS
  Administered 2012-12-07: 5 [IU] via SUBCUTANEOUS

## 2012-12-03 MED ORDER — SODIUM CHLORIDE 0.9 % IJ SOLN
3.0000 mL | Freq: Two times a day (BID) | INTRAMUSCULAR | Status: DC
  Administered 2012-12-03 – 2012-12-05 (×2): 3 mL via INTRAVENOUS

## 2012-12-03 MED ORDER — SODIUM CHLORIDE 0.9 % IV SOLN
250.0000 mL | INTRAVENOUS | Status: DC | PRN
  Administered 2012-12-04: 250 mL via INTRAVENOUS

## 2012-12-03 MED ORDER — CHLORPROMAZINE HCL 25 MG PO TABS
25.0000 mg | ORAL_TABLET | Freq: Three times a day (TID) | ORAL | Status: DC
  Administered 2012-12-04 – 2012-12-07 (×7): 25 mg via ORAL
  Filled 2012-12-03 (×14): qty 1

## 2012-12-03 MED ORDER — MORPHINE SULFATE 2 MG/ML IJ SOLN
2.0000 mg | INTRAMUSCULAR | Status: DC | PRN
  Administered 2012-12-03 – 2012-12-08 (×7): 2 mg via INTRAVENOUS
  Filled 2012-12-03 (×7): qty 1

## 2012-12-03 MED ORDER — ALLOPURINOL 100 MG PO TABS
200.0000 mg | ORAL_TABLET | Freq: Every day | ORAL | Status: DC
  Administered 2012-12-04 – 2012-12-08 (×6): 200 mg via ORAL
  Filled 2012-12-03 (×5): qty 2

## 2012-12-03 MED ORDER — TRAZODONE HCL 50 MG PO TABS
50.0000 mg | ORAL_TABLET | Freq: Every day | ORAL | Status: DC
  Administered 2012-12-04 – 2012-12-07 (×4): 50 mg via ORAL
  Filled 2012-12-03 (×8): qty 1

## 2012-12-03 MED ORDER — PROCHLORPERAZINE MALEATE 10 MG PO TABS
10.0000 mg | ORAL_TABLET | Freq: Four times a day (QID) | ORAL | Status: DC | PRN
  Administered 2012-12-07: 10 mg via ORAL
  Filled 2012-12-03: qty 1

## 2012-12-03 MED ORDER — SODIUM CHLORIDE 0.9 % IJ SOLN: 3.0000 mL | INTRAMUSCULAR | Status: DC | PRN

## 2012-12-03 MED ORDER — ASPIRIN EC 81 MG PO TBEC
81.0000 mg | DELAYED_RELEASE_TABLET | Freq: Every day | ORAL | Status: DC
  Administered 2012-12-04 – 2012-12-08 (×5): 81 mg via ORAL
  Filled 2012-12-03 (×6): qty 1

## 2012-12-03 MED ORDER — HYDRALAZINE HCL 20 MG/ML IJ SOLN
5.0000 mg | Freq: Four times a day (QID) | INTRAMUSCULAR | Status: DC | PRN
  Administered 2012-12-03 – 2012-12-04 (×2): 5 mg via INTRAVENOUS
  Filled 2012-12-03 (×2): qty 1

## 2012-12-03 MED ORDER — SODIUM CHLORIDE 0.9 % IJ SOLN
3.0000 mL | Freq: Two times a day (BID) | INTRAMUSCULAR | Status: DC
  Administered 2012-12-05 – 2012-12-07 (×2): 3 mL via INTRAVENOUS

## 2012-12-03 MED ORDER — INSULIN ASPART 100 UNIT/ML ~~LOC~~ SOLN
0.0000 [IU] | Freq: Every day | SUBCUTANEOUS | Status: DC
Start: 2012-12-03 — End: 2012-12-08
  Administered 2012-12-03: 2 [IU] via SUBCUTANEOUS
  Administered 2012-12-05: 5 [IU] via SUBCUTANEOUS
  Administered 2012-12-06: 3 [IU] via SUBCUTANEOUS
  Administered 2012-12-07: 2 [IU] via SUBCUTANEOUS

## 2012-12-03 NOTE — Progress Notes (Addendum)
Pt arrived via ambulance from [facility], Wife is at bedside. Pt c/o pain in mid chest and mid abdomen, rating it 9/10. States it is a sharp pain. Pt becoming very restless waiting for an order for pain medicine. O2 @ 2L via [place]. Pt sleeping comfortably after receiving pain medicine. Pt to be transferred to Telemetry floor after shift change. Pt came with IV 20 gauge in Lt AC.

## 2012-12-03 NOTE — Telephone Encounter (Signed)
Pt's wife called stating that pt is in the hospital in [place]. Dr Cleone Slim spoke with her and states he possibly should be transferred to [facility].  She wanted to let us know that he will not be there for f/u and lab today.  Will inform Dr Donnald Garre.  SLJ

## 2012-12-03 NOTE — Progress Notes (Signed)
Pt currently at [facility] and request by Dr. Arbutus Ped to have transfer to [facility], pt has lung cancer and plan for treatment in [facility]. Admission to medical floor requested by transferring doctor and I have accepted request.  Debbora Presto, MD  [facility] Pager [phone]  If 7PM-7AM, please contact night-coverage [email or web address] Password [email or web address]

## 2012-12-03 NOTE — Progress Notes (Signed)
PHARMACIST - PHYSICIAN ORDER COMMUNICATION  CONCERNING: P&T Medication Policy on Herbal Medications  DESCRIPTION:  This patient's order for: Glucosamine-Chondroitin has been noted.  This product(s) is classified as an "herbal" or natural product. Due to a lack of definitive safety studies or FDA approval, nonstandard manufacturing practices, plus the potential risk of unknown drug-drug interactions while on inpatient medications, the Pharmacy and Therapeutics Committee does not permit the use of "herbal" or natural products of this type within [facility].   ACTION TAKEN: The pharmacy department is unable to verify this order at this time and your patient has been informed of this safety policy. Please reevaluate patient's clinical condition at discharge and address if the herbal or natural product(s) should be resumed at that time.   Loralee Pacas, PharmD, BCPS 12/03/2012 6:05 PM

## 2012-12-03 NOTE — H&P (Signed)
[facility] History and Physical  Phillip [name] WGN:[id number] DOB: Sep 23, 1932 DOA: 12/03/2012  Referring physician: Dr. Arbutus Ped PCP: Louie Boston, MD  Specialists: Dr. Arbutus Ped (oncology)  Chief Complaint: Chest pain  HPI: Phillip [name] is a 79 y.o. male  With history of Lung cancer, right hilar/paratracheal/supraclavicular/porta hepatic/portacaval adenopathy, liver and bone lesions (presumed to be metastatic lung cancer). Who presented to the ED complaining of chest discomfort which has been present for the last 1 month.  The problem was gradual but persistent and currently not getting better. Morphine has helped.    We have been requested to manage patient's medical problems while in the hospital. Oncology will follow reportedly and make further recommendations regarding lung mass with liver and bone lesions.    Review of Systems: 10 point review of system reviewed and negative unless otherwise mentioned above.  Past Medical History  Diagnosis Date  . Hypertension   . Pulmonary fibrosis 2010  . Sleep apnea   . Diabetes mellitus without complication   . GERD (gastroesophageal reflux disease)   . Arthritis   . Lung cancer   . Cancer   . Anxiety    Past Surgical History  Procedure Laterality Date  . Prostate surgery      Patient wife states they rimmed out prostate   Social History:  reports that he quit smoking about 39 years ago. His smoking use included Cigarettes and Pipe. He has a 20 pack-year smoking history. He has never used smokeless tobacco. He reports that he does not drink alcohol. His drug history is not on file. Lives with wife  Can patient participate in ADLs? yes  No Known Allergies  No family history on file.  None reported  Prior to Admission medications   Medication Sig Start Date End Date Taking? Authorizing Provider  allopurinol (ZYLOPRIM) 100 MG tablet Take 200 mg by mouth daily.    Historical Provider, MD  aspirin 81 MG tablet Take 81 mg by  mouth daily.    Historical Provider, MD  bumetanide (BUMEX) 1 MG tablet Take 1 mg by mouth daily.    Historical Provider, MD  chlorproMAZINE (THORAZINE) 25 MG tablet Take 25 mg by mouth 3 (three) times daily.    Historical Provider, MD  diazepam (VALIUM) 5 MG tablet Take 5 mg by mouth at bedtime as needed for sleep.    Historical Provider, MD  glucosamine-chondroitin 500-400 MG tablet Take 1 tablet by mouth 2 (two) times daily.    Historical Provider, MD  HYDROcodone-acetaminophen (NORCO) 10-325 MG per tablet Take 1 tablet by mouth every 6 (six) hours as needed for pain.    Historical Provider, MD  Insulin Isophane & Regular (NOVOLIN 70/30 RELION [place]) Inject 55 Units into the skin 2 (two) times daily.    Historical Provider, MD  lidocaine-prilocaine (EMLA) cream Apply topically as needed. 11/22/12   Si Gaul, MD  losartan (COZAAR) 100 MG tablet Take 100 mg by mouth daily.    Historical Provider, MD  Magnesium-Zinc (MAGNESIUM-CHELATED ZINC PO) Take 1 tablet by mouth 3 (three) times daily.    Historical Provider, MD  methylPREDNISolone (MEDROL, PAK,) 4 MG tablet follow package directions 11/28/12   Si Gaul, MD  Multiple Vitamin (MULTIVITAMIN WITH MINERALS) TABS Take 1 tablet by mouth daily.    Historical Provider, MD  prochlorperazine (COMPAZINE) 10 MG tablet Take 1 tablet (10 mg total) by mouth every 6 (six) hours as needed. 11/22/12   Si Gaul, MD  traZODone (DESYREL) 50 MG tablet Take  50 mg by mouth at bedtime.  11/05/12   Historical Provider, MD   Physical Exam: Filed Vitals:   12/03/12 1651  BP: 190/92  Pulse: 91  Temp: 97.9 F (36.6 C)  TempSrc: Oral  Resp: 20  Height: 5\' 9"  (1.753 m)  Weight: 87.998 kg (194 lb)  SpO2: 94%     General:  Pt looks uncomfortable, in NAD  Eyes: EOMI, non icteric  ENT: normal exterior appearance, MMM  Neck: supple, no goiter  Cardiovascular: RRR, no MRG  Respiratory: Rhales BL, no wheezes  Abdomen: soft, ND  Skin: warm and  dry  Musculoskeletal: no cyanosis or clubbing  Psychiatric:  Mood and affect appropriate  Neurologic: answers questions appropriately, no facial asymmetry  Labs on Admission:  Basic Metabolic Panel:  Recent Labs Lab 11/30/12 1105  CREATININE 1.31   Liver Function Tests: No results found for this basename: AST, ALT, ALKPHOS, BILITOT, PROT, ALBUMIN,  in the last 168 hours No results found for this basename: LIPASE, AMYLASE,  in the last 168 hours No results found for this basename: AMMONIA,  in the last 168 hours CBC: No results found for this basename: WBC, NEUTROABS, HGB, HCT, MCV, PLT,  in the last 168 hours Cardiac Enzymes: No results found for this basename: CKTOTAL, CKMB, CKMBINDEX, TROPONINI,  in the last 168 hours  BNP (last 3 results) No results found for this basename: PROBNP,  in the last 8760 hours CBG: No results found for this basename: GLUCAP,  in the last 168 hours  Radiological Exams on Admission: No results found.   Assessment/Plan Active Problems:  1. [facility] - Oncology requested admission.  Suspect oncology to follow and make further recommendations given patient's current findings. - Pain control  2. HTN - hold cozaar until S creatinine available - hydralazine prn SBP > 180 or DBP > 110  3. DM - DIabetic diet - SSI with night coverage  4. Pulmonary fibrosis - most likely cause of rhales on PE - supplemental oxygen prn - albuterol prn sob or wheeze  5. DVT prophylaxis - SCD's at this juncture since there is no lab work and I do not know if patient has thrombocytopenia or profound anemia.   - If lab work comes back with normal h/h and platelet count would plan on placing patient on heparin sq given # 1 and predisposition for hypercoagulation   Code Status: full Family Communication: discussed with patient and wife. Disposition Plan: Pending further evaluation and recommendations from oncologist  Time spent: > 60 minutes  Penny Pia [facility] Pager [phone]  If 7PM-7AM, please contact night-coverage [email or web address] Password [facility] 12/03/2012, 6:05 PM

## 2012-12-04 ENCOUNTER — Ambulatory Visit: Payer: Medicare Other

## 2012-12-04 ENCOUNTER — Encounter: Payer: Medicare Other | Admitting: Nutrition

## 2012-12-04 DIAGNOSIS — R748 Abnormal levels of other serum enzymes: Secondary | ICD-10-CM | POA: Diagnosis present

## 2012-12-04 DIAGNOSIS — E43 Unspecified severe protein-calorie malnutrition: Secondary | ICD-10-CM | POA: Insufficient documentation

## 2012-12-04 DIAGNOSIS — C7952 Secondary malignant neoplasm of bone marrow: Secondary | ICD-10-CM

## 2012-12-04 DIAGNOSIS — E871 Hypo-osmolality and hyponatremia: Secondary | ICD-10-CM

## 2012-12-04 DIAGNOSIS — C787 Secondary malignant neoplasm of liver and intrahepatic bile duct: Secondary | ICD-10-CM

## 2012-12-04 LAB — TSH: TSH: 3.74 u[IU]/mL (ref 0.350–4.500)

## 2012-12-04 LAB — GLUCOSE, CAPILLARY
Glucose-Capillary: 206 mg/dL — ABNORMAL HIGH (ref 70–99)
Glucose-Capillary: 235 mg/dL — ABNORMAL HIGH (ref 70–99)
Glucose-Capillary: 161 mg/dL — ABNORMAL HIGH (ref 70–99)
Glucose-Capillary: 142 mg/dL — ABNORMAL HIGH (ref 70–99)

## 2012-12-04 LAB — CBC
HCT: 43.4 % (ref 39.0–52.0)
MCH: 31.9 pg (ref 26.0–34.0)
MCHC: 33.9 g/dL (ref 30.0–36.0)
MCV: 94.1 fL (ref 78.0–100.0)
Platelets: 125 10*3/uL — ABNORMAL LOW (ref 150–400)
RDW: 16.1 % — ABNORMAL HIGH (ref 11.5–15.5)
WBC: 11 10*3/uL — ABNORMAL HIGH (ref 4.0–10.5)

## 2012-12-04 LAB — BASIC METABOLIC PANEL
BUN: 19 mg/dL (ref 6–23)
CO2: 24 mEq/L (ref 19–32)
Calcium: 8.7 mg/dL (ref 8.4–10.5)
Creatinine, Ser: 0.99 mg/dL (ref 0.50–1.35)

## 2012-12-04 LAB — APTT: aPTT: 32 seconds (ref 24–37)

## 2012-12-04 MED ORDER — ENOXAPARIN SODIUM 40 MG/0.4ML ~~LOC~~ SOLN
40.0000 mg | SUBCUTANEOUS | Status: DC
  Administered 2012-12-04 – 2012-12-05 (×2): 40 mg via SUBCUTANEOUS
  Filled 2012-12-04 (×3): qty 0.4

## 2012-12-04 MED ORDER — SODIUM CHLORIDE 0.9 % IJ SOLN: 10.0000 mL | INTRAMUSCULAR | Status: DC | PRN

## 2012-12-04 MED ORDER — SODIUM CHLORIDE 0.9 % IV SOLN
400.0000 mg | Freq: Once | INTRAVENOUS | Status: AC
  Administered 2012-12-04: 400 mg via INTRAVENOUS
  Filled 2012-12-04: qty 40

## 2012-12-04 MED ORDER — SODIUM CHLORIDE 0.9 % IV SOLN: Freq: Once | INTRAVENOUS | Status: DC

## 2012-12-04 MED ORDER — VITAMINS A & D EX OINT
TOPICAL_OINTMENT | CUTANEOUS | Status: AC
  Administered 2012-12-04: 5
  Filled 2012-12-04: qty 5

## 2012-12-04 MED ORDER — SODIUM CHLORIDE 0.9 % IV SOLN
INTRAVENOUS | Status: DC
  Administered 2012-12-04: 75 mL/h via INTRAVENOUS
  Administered 2012-12-05: 100 mL/h via INTRAVENOUS
  Administered 2012-12-05 – 2012-12-06 (×2): via INTRAVENOUS
  Administered 2012-12-06: 100 mL/h via INTRAVENOUS

## 2012-12-04 MED ORDER — HOT PACK MISC ONCOLOGY
1.0000 | Freq: Once | Status: AC | PRN
  Filled 2012-12-04: qty 1

## 2012-12-04 MED ORDER — HEPARIN SOD (PORK) LOCK FLUSH 100 UNIT/ML IV SOLN
250.0000 [IU] | Freq: Once | INTRAVENOUS | Status: AC | PRN
  Filled 2012-12-04: qty 3

## 2012-12-04 MED ORDER — SODIUM CHLORIDE 0.9 % IV SOLN
80.0000 mg/m2 | Freq: Once | INTRAVENOUS | Status: AC
  Administered 2012-12-04: 160 mg via INTRAVENOUS
  Filled 2012-12-04: qty 8

## 2012-12-04 MED ORDER — SODIUM CHLORIDE 0.9 % IJ SOLN
3.0000 mL | INTRAMUSCULAR | Status: DC | PRN
  Administered 2012-12-04: 3 mL via INTRAVENOUS

## 2012-12-04 MED ORDER — ENSURE COMPLETE PO LIQD
237.0000 mL | Freq: Two times a day (BID) | ORAL | Status: DC
  Administered 2012-12-04 – 2012-12-08 (×8): 237 mL via ORAL

## 2012-12-04 MED ORDER — SODIUM CHLORIDE 0.9 % IV SOLN
Freq: Once | INTRAVENOUS | Status: AC
  Administered 2012-12-04: 16 mg via INTRAVENOUS
  Filled 2012-12-04: qty 8

## 2012-12-04 MED ORDER — ALTEPLASE 2 MG IJ SOLR: 2.0000 mg | Freq: Once | INTRAMUSCULAR | Status: AC | PRN

## 2012-12-04 MED ORDER — HEPARIN SOD (PORK) LOCK FLUSH 100 UNIT/ML IV SOLN
500.0000 [IU] | Freq: Once | INTRAVENOUS | Status: AC | PRN
  Filled 2012-12-04: qty 5

## 2012-12-04 NOTE — Care Management Note (Addendum)
    Page 1 of 1   12/11/2012     4:28:11 PM   CARE MANAGEMENT NOTE 12/11/2012  Patient:  Phillip [name], Phillip [name]   Account Number:  [id number]  Date Initiated:  12/04/2012  Documentation initiated by:  Lanier Clam  Subjective/Objective Assessment:   ADMITTED W/CHEST PAIN.ZO:XWRU CA,BRAIN/LIVER METS.     Action/Plan:   FROM HOME,SPOUSE.HAS PCP,PHARMACY.ACTIVE W/[facility]-HHRN.   Anticipated DC Date:  12/07/2012   Anticipated DC Plan:  HOME W HOME HEALTH SERVICES      DC Planning Services  CM consult      [facility] Choice  Resumption Of Svcs/PTA Provider   Choice offered to / List presented to:  C-1 Patient        HH arranged  HH-2 PT      [facility] agency  [facility]   Status of service:  Completed, signed off Medicare Important Message given?   (If response is "NO", the following Medicare IM given date fields will be blank) Date Medicare IM given:   Date Additional Medicare IM given:    Discharge Disposition:  HOME W HOME HEALTH SERVICES  Per UR Regulation:  Reviewed for med. necessity/level of care/duration of stay  If discussed at Long Length of Stay Meetings, dates discussed:    Comments:  12/11/12 Zen Felling RN,BSN NCM [phone] PER [facility] KRISTEN RECEIVED HHPT ORDER.  12/04/12 Chicquita Mendel RN,BSN NCM [phone] CONFIRMED W/[facility] KRISTEN ACTIVE W/HHRN.

## 2012-12-04 NOTE — Progress Notes (Signed)
[facility]  Patient Status: Active (receiving services up to time of hospitalization)  [facility] is providing the following services: RN  If patient discharges after hours, please call [phone].   Phillip [name] 12/04/2012, 4:27 PM

## 2012-12-04 NOTE — Progress Notes (Signed)
Subjective: The patient is seen and examined today. His wife is at the bedside. He was admitted to [facility] with abdominal pain and CT scan of the abdomen showed evidence for disease progression in the abdomen causing his abdominal pain. He was supposed to start systemic chemotherapy yesterday at the [facility]. He was transferred to [facility] for further evaluation and to start his systemic therapy. He feels a little bit better today. He denied having any fever or chills. He has no nausea or vomiting. The patient denied having any significant chest pain but continues to have shortness breath with exertion and mild cough with no hemoptysis.  Objective: Vital signs in last 24 hours: Temp:  [97.7 F (36.5 C)-98.2 F (36.8 C)] 98.1 F (36.7 C) (07/15 1331) Pulse Rate:  [80-95] 95 (07/15 1331) Resp:  [20] 20 (07/15 1331) BP: (145-193)/(85-99) 145/85 mmHg (07/15 1331) SpO2:  [94 %-96 %] 96 % (07/15 1331) Weight:  [189 lb 6 oz (85.9 kg)] 189 lb 6 oz (85.9 kg) (07/14 2038)  Intake/Output from previous day: 07/14 0701 - 07/15 0700 In: 100 [P.O.:100] Out: 605 [Urine:605] Intake/Output this shift: Total I/O In: 300 [P.O.:300] Out: 275 [Urine:275]  General appearance: alert, cooperative, fatigued and no distress Resp: clear to auscultation bilaterally Cardio: regular rate and rhythm, S1, S2 normal, no murmur, click, rub or gallop GI: Mildly tender to palpation in the upper right quadrant Extremities: extremities normal, atraumatic, no cyanosis or edema  Lab Results:   Recent Labs  12/03/12 1857 12/04/12 0515  WBC 10.6* 11.0*  HGB 15.8 14.7  HCT 45.0 43.4  PLT 147* 125*   BMET  Recent Labs  12/03/12 1857 12/04/12 0515  NA 126* 125*  K 4.5 4.2  CL 91* 92*  CO2 26 24  GLUCOSE 196* 175*  BUN 17 19  CREATININE 0.98 0.99  CALCIUM 8.8 8.7    Studies/Results: No results found.  Medications: I have reviewed the patient's current  medications.  Assessment/Plan: This is a very pleasant 79 years old white male recently diagnosed with extensive stage small cell lung cancer with bilateral pulmonary nodules, mediastinal and supraclavicular lymphadenopathy as well as liver and bone metastasis. I have a lengthy discussion with the patient and his wife today about his condition. I would recommend for him to proceed with systemic chemotherapy with reduced dose carboplatin for AUC of 4 on day 1 and etoposide at 80 mg/M2 on days 1, 2 and 3 with Neulasta support on day 4. First cycle expected today. I will continue to monitor the patient closely during his admission for management of any adverse effect of his chemotherapy. Thank you for taking good care of Mr. Iiams. Please call if you have any questions.  LOS: 1 day    Bertrum Helmstetter K. 12/04/2012

## 2012-12-04 NOTE — Progress Notes (Signed)
ANTICOAGULATION CONSULT NOTE - Initial Consult  Pharmacy Consult for Lovenox Indication: VTE prophylaxis  No Known Allergies  Patient Measurements: Height: 5\' 9"  (175.3 cm) Weight: 189 lb 6 oz (85.9 kg) IBW/kg (Calculated) : 70.7  Vital Signs: Temp: 98.1 F (36.7 C) (07/15 1331) Temp src: Oral (07/15 1331) BP: 145/85 mmHg (07/15 1331) Pulse Rate: 95 (07/15 1331)  Labs:  Recent Labs  12/03/12 1857 12/04/12 0515  HGB 15.8 14.7  HCT 45.0 43.4  PLT 147* 125*  LABPROT 14.6  --   INR 1.16  --   CREATININE 0.98 0.99    Estimated Creatinine Clearance: 66.8 ml/min (by C-G formula based on Cr of 0.99).   Medical History: Past Medical History  Diagnosis Date  . Hypertension   . Pulmonary fibrosis 2010  . Sleep apnea   . Diabetes mellitus without complication   . GERD (gastroesophageal reflux disease)   . Arthritis   . Anxiety     Medications:  Scheduled:  . allopurinol  200 mg Oral Daily  . aspirin EC  81 mg Oral Daily  . chlorproMAZINE  25 mg Oral TID  . feeding supplement  237 mL Oral BID BM  . insulin aspart  0-15 Units Subcutaneous TID WC  . insulin aspart  0-5 Units Subcutaneous QHS  . sodium chloride  3 mL Intravenous Q12H  . sodium chloride  3 mL Intravenous Q12H  . traZODone  50 mg Oral QHS   Infusions:  . sodium chloride      Assessment:  79 year old male with lung mass with bone and liver lesions  Lovenox to be initiated for DVT prophylaxis  CrCl ~ 66 ml/min  Goal of Therapy:   Monitor platelets by anticoagulation protocol: Yes   Plan:  Lovenox 40mg  sq q24h Follow Scr, CBC  Crockett Rallo, Joselyn Glassman, PharmD 12/04/2012,4:54 PM

## 2012-12-04 NOTE — Progress Notes (Signed)
INITIAL NUTRITION ASSESSMENT  Pt meets criteria for severe MALNUTRITION in the context of chronic illness as evidenced by <75% estimated energy intake with 12% weight loss in the past 2 months per wife's report.  DOCUMENTATION CODES Per approved criteria  -Severe malnutrition in the context of chronic illness   INTERVENTION: - Ensure Complete BID - Discussed importance of high calorie/protein diet to preserve lean body mass and prevent further unintended weight loss - Will continue to monitor   NUTRITION DIAGNOSIS: Inadequate oral intake related to poor appetite as evidenced by <25% meal intake, unintended weight loss.   Goal: Pt to consume >90% of meals/supplements  Monitor:  Weights, labs, intake  Reason for Assessment: Nutrition risk   79 y.o. male  Admitting Dx: Chest pain  ASSESSMENT: Pt with recently diagnosed extensive stage small cell lung CA, scheduled for possible chemotherapy initiation this week per discussion with wife. Pt admitted with chest discomfort x 1 month. Pt asleep during visit, wife present at bedside. She reports pt with poor appetite/intake for the past 2 months with pt consuming <50% of usual intake with at least 26 pound unintended weight loss. Not on any nutritional supplements at home.   Height: Ht Readings from Last 1 Encounters:  12/03/12 5\' 9"  (1.753 m)    Weight: Wt Readings from Last 1 Encounters:  12/03/12 189 lb 6 oz (85.9 kg)    Ideal Body Weight: 160 lb  % Ideal Body Weight: 118%  Wt Readings from Last 10 Encounters:  12/03/12 189 lb 6 oz (85.9 kg)  11/26/12 194 lb (87.998 kg)  11/22/12 190 lb 14.4 oz (86.592 kg)  11/15/12 198 lb (89.812 kg)  04/27/10 214 lb (97.07 kg)  04/08/09 236 lb (107.049 kg)  12/31/07 240 lb 8 oz (109.09 kg)  11/27/07 239 lb (108.41 kg)  10/10/07 240 lb (108.863 kg)  05/10/07 239 lb (108.41 kg)    Usual Body Weight: 215-225 lb per wife  % Usual Body Weight: 84-88%  BMI:  Body mass index is  27.95 kg/(m^2).  Estimated Nutritional Needs: Kcal: 1750-1950 Protein: 85-95g Fluid: 1.7-1.9L/day  Skin: Intact   Diet Order: Carb Control  EDUCATION NEEDS: -Education needs addressed - educated wife on high calorie/protein diet and provided handouts of this information with [facility] [address] contact information   Intake/Output Summary (Last 24 hours) at 12/04/12 1514 Last data filed at 12/04/12 1331  Gross per 24 hour  Intake    400 ml  Output    880 ml  Net   -480 ml    Last BM: 7/13  Labs:   Recent Labs Lab 11/30/12 1105 12/03/12 1857 12/04/12 0515  NA  --  126* 125*  K  --  4.5 4.2  CL  --  91* 92*  CO2  --  26 24  BUN  --  17 19  CREATININE 1.31 0.98 0.99  CALCIUM  --  8.8 8.7  MG  --  1.9  --   PHOS  --  2.7  --   GLUCOSE  --  196* 175*    CBG (last 3)   Recent Labs  12/03/12 2140 12/04/12 0753 12/04/12 1154  GLUCAP 225* 206* 235*    Scheduled Meds: . allopurinol  200 mg Oral Daily  . aspirin EC  81 mg Oral Daily  . chlorproMAZINE  25 mg Oral TID  . insulin aspart  0-15 Units Subcutaneous TID WC  . insulin aspart  0-5 Units Subcutaneous QHS  . sodium  chloride  3 mL Intravenous Q12H  . sodium chloride  3 mL Intravenous Q12H  . traZODone  50 mg Oral QHS    Continuous Infusions: . sodium chloride      Past Medical History  Diagnosis Date  . Hypertension   . Pulmonary fibrosis 2010  . Sleep apnea   . Diabetes mellitus without complication   . GERD (gastroesophageal reflux disease)   . Arthritis   . Anxiety     Past Surgical History  Procedure Laterality Date  . Prostate surgery      Patient wife states they rimmed out prostate     Levon Hedger MS, RD, LDN [phone] Pager [phone] After Hours Pager

## 2012-12-04 NOTE — Progress Notes (Signed)
[facility] PROGRESS NOTE  Phillip [name] ZOX:[id number] DOB: 08/18/1932 DOA: 12/03/2012 PCP: Louie Boston, MD  Assessment/Plan:  1-Hyponatremia: Patient appears dehydrated clinically. I will start IV NS fluids. Will check urine osmolality. Records from prior hospital reviewed; Sodium on admission was at 121, day of transfer at 127. Differential Diagnosis for hyponatremia: decrease volume, SIADH.   2-Abdominal pain: Per transfer note patient had A ct scan that showed no definitive acute abnormalities, numerous hepatic lesions and cholelithiasis. He has increase LFT. I will order Abdominal US. Will discussed with Dr Arbutus Ped.   3-SCC -Pet Scan: Bilateral pulmonary lesions are hypermetabolic consistent with  lung cancer. Multiple hypermetabolic right hilar, right paratracheal and  right supraclavicular lymph nodes consistent with metastatic  adenopathy. Multi focal hypermetabolic liver lesions. Hypermetabolic porta hepatic and portacaval adenopathy. Hypermetabolic bone metastasis to the lumbar spine. Splenomegaly. -Oncology requested admission.  -Dr Arbutus Ped consulted.   4-HTN  - hold cozaar due to hyponatremia.  - hydralazine prn SBP > 180 or DBP > 110   5-DM  - DIabetic diet  - SSI with night coverage   6-Pulmonary fibrosis  - supplemental oxygen prn  - albuterol prn sob or wheeze   7-DVT prophylaxis: Lovenox. Check PTT.    Code Status: Full Code.  Family Communication: Care discussed with wife who was at bedside.  Disposition Plan: To be determine   Consultants:  Dr Arbutus Ped.   Procedures:  none  Antibiotics:  none  HPI/Subjective: Patient presents to [facility] hospital Sunday 7-13  with complain  of abdominal pain. He was admitted and transfer to [facility] long 7-14 for further care of metastatic cancer. He was found to have hyponatremia and liver metastasis.  Patient with no complaints. He is sleepy today, was not able to sleep last night. He denies worsening  abdominal pain.   Objective: Filed Vitals:   12/04/12 0005 12/04/12 0013 12/04/12 0605 12/04/12 0716  BP: 178/95 166/89 193/92 151/85  Pulse:   82   Temp:   97.7 F (36.5 C)   TempSrc:   Oral   Resp:   20   Height:      Weight:      SpO2:   94%     Intake/Output Summary (Last 24 hours) at 12/04/12 0948 Last data filed at 12/04/12 0005  Gross per 24 hour  Intake    100 ml  Output    605 ml  Net   -505 ml   Filed Weights   12/03/12 1651 12/03/12 2038  Weight: 87.998 kg (194 lb) 85.9 kg (189 lb 6 oz)    Exam:   General:  No distress, sleepy.   Cardiovascular: S 1, S 2 RRR  Respiratory: CTA  Abdomen: BS present, soft, Mild RUQ tenderness, no rigidity.   Musculoskeletal: no edema.   Data Reviewed: Basic Metabolic Panel:  Recent Labs Lab 11/30/12 1105 12/03/12 1857 12/04/12 0515  NA  --  126* 125*  K  --  4.5 4.2  CL  --  91* 92*  CO2  --  26 24  GLUCOSE  --  196* 175*  BUN  --  17 19  CREATININE 1.31 0.98 0.99  CALCIUM  --  8.8 8.7  MG  --  1.9  --   PHOS  --  2.7  --    Liver Function Tests:  Recent Labs Lab 12/03/12 1857  AST 164*  ALT 85*  ALKPHOS 548*  BILITOT 2.2*  PROT 8.1  ALBUMIN 2.4*   No results  found for this basename: LIPASE, AMYLASE,  in the last 168 hours No results found for this basename: AMMONIA,  in the last 168 hours CBC:  Recent Labs Lab 12/03/12 1857 12/04/12 0515  WBC 10.6* 11.0*  HGB 15.8 14.7  HCT 45.0 43.4  MCV 93.9 94.1  PLT 147* 125*   Cardiac Enzymes: No results found for this basename: CKTOTAL, CKMB, CKMBINDEX, TROPONINI,  in the last 168 hours BNP (last 3 results) No results found for this basename: PROBNP,  in the last 8760 hours CBG:  Recent Labs Lab 12/03/12 1838 12/03/12 2140 12/04/12 0753  GLUCAP 189* 225* 206*    No results found for this or any previous visit (from the past 240 hour(s)).   Studies: No results found.  Scheduled Meds: . allopurinol  200 mg Oral Daily  . aspirin EC   81 mg Oral Daily  . chlorproMAZINE  25 mg Oral TID  . insulin aspart  0-15 Units Subcutaneous TID WC  . insulin aspart  0-5 Units Subcutaneous QHS  . sodium chloride  3 mL Intravenous Q12H  . sodium chloride  3 mL Intravenous Q12H  . traZODone  50 mg Oral QHS  . vitamin A & D       Continuous Infusions: . sodium chloride      Active Problems:   DM   HYPERTENSION   PULMONARY FIBROSIS ILD POST INFLAMMATORY CHRONIC   Small cell lung cancer    Time spent: 35 minutes.     Maalle Starrett  [facility] Pager [phone]. If 7PM-7AM, please contact night-coverage at [email or web address], password [facility] 12/04/2012, 9:48 AM  LOS: 1 day

## 2012-12-05 ENCOUNTER — Inpatient Hospital Stay (HOSPITAL_COMMUNITY): Payer: Medicare Other

## 2012-12-05 ENCOUNTER — Ambulatory Visit: Payer: Medicare Other

## 2012-12-05 LAB — CBC
MCHC: 34.5 g/dL (ref 30.0–36.0)
Platelets: 106 10*3/uL — ABNORMAL LOW (ref 150–400)
RDW: 16.5 % — ABNORMAL HIGH (ref 11.5–15.5)
WBC: 7.9 10*3/uL (ref 4.0–10.5)

## 2012-12-05 LAB — GLUCOSE, CAPILLARY
Glucose-Capillary: 190 mg/dL — ABNORMAL HIGH (ref 70–99)
Glucose-Capillary: 393 mg/dL — ABNORMAL HIGH (ref 70–99)
Glucose-Capillary: 345 mg/dL — ABNORMAL HIGH (ref 70–99)

## 2012-12-05 LAB — COMPREHENSIVE METABOLIC PANEL
AST: 118 U/L — ABNORMAL HIGH (ref 0–37)
Albumin: 1.9 g/dL — ABNORMAL LOW (ref 3.5–5.2)
Alkaline Phosphatase: 480 U/L — ABNORMAL HIGH (ref 39–117)
BUN: 26 mg/dL — ABNORMAL HIGH (ref 6–23)
Chloride: 97 mEq/L (ref 96–112)
Potassium: 4.8 mEq/L (ref 3.5–5.1)
Sodium: 130 mEq/L — ABNORMAL LOW (ref 135–145)
Total Bilirubin: 2.2 mg/dL — ABNORMAL HIGH (ref 0.3–1.2)
Total Protein: 6.7 g/dL (ref 6.0–8.3)

## 2012-12-05 LAB — OSMOLALITY, URINE: Osmolality, Ur: 667 mOsm/kg (ref 390–1090)

## 2012-12-05 MED ORDER — PROCHLORPERAZINE MALEATE 10 MG PO TABS
10.0000 mg | ORAL_TABLET | Freq: Once | ORAL | Status: AC
  Administered 2012-12-05: 10 mg via ORAL
  Filled 2012-12-05: qty 1

## 2012-12-05 MED ORDER — SODIUM CHLORIDE 0.9 % IV SOLN
Freq: Once | INTRAVENOUS | Status: AC
  Administered 2012-12-05: 16 mg via INTRAVENOUS
  Filled 2012-12-05: qty 8

## 2012-12-05 MED ORDER — HEPARIN SOD (PORK) LOCK FLUSH 100 UNIT/ML IV SOLN: 500.0000 [IU] | Freq: Once | INTRAVENOUS | Status: AC | PRN

## 2012-12-05 MED ORDER — LORAZEPAM 2 MG/ML IJ SOLN
1.0000 mg | Freq: Four times a day (QID) | INTRAMUSCULAR | Status: DC | PRN
  Administered 2012-12-06: 1 mg via INTRAVENOUS
  Filled 2012-12-05: qty 1

## 2012-12-05 MED ORDER — SODIUM CHLORIDE 0.9 % IV SOLN
80.0000 mg/m2 | Freq: Once | INTRAVENOUS | Status: AC
  Administered 2012-12-05: 160 mg via INTRAVENOUS
  Filled 2012-12-05: qty 8

## 2012-12-05 MED ORDER — SODIUM CHLORIDE 0.9 % IV SOLN: Freq: Once | INTRAVENOUS | Status: AC

## 2012-12-05 MED ORDER — INSULIN ASPART PROT & ASPART (70-30 MIX) 100 UNIT/ML ~~LOC~~ SUSP
25.0000 [IU] | Freq: Two times a day (BID) | SUBCUTANEOUS | Status: DC
  Administered 2012-12-05 – 2012-12-07 (×4): 25 [IU] via SUBCUTANEOUS
  Filled 2012-12-05: qty 10

## 2012-12-05 MED ORDER — SODIUM CHLORIDE 0.9 % IJ SOLN: 10.0000 mL | INTRAMUSCULAR | Status: DC | PRN

## 2012-12-05 MED ORDER — ALTEPLASE 2 MG IJ SOLR
2.0000 mg | Freq: Once | INTRAMUSCULAR | Status: AC | PRN
  Filled 2012-12-05: qty 2

## 2012-12-05 MED ORDER — PROMETHAZINE HCL 25 MG/ML IJ SOLN: 12.5000 mg | INTRAMUSCULAR | Status: DC | PRN

## 2012-12-05 MED ORDER — HOT PACK MISC ONCOLOGY
1.0000 | Freq: Once | Status: AC | PRN
  Filled 2012-12-05: qty 1

## 2012-12-05 MED ORDER — SODIUM CHLORIDE 0.9 % IJ SOLN: 3.0000 mL | INTRAMUSCULAR | Status: DC | PRN

## 2012-12-05 MED ORDER — HEPARIN SOD (PORK) LOCK FLUSH 100 UNIT/ML IV SOLN: 250.0000 [IU] | Freq: Once | INTRAVENOUS | Status: AC | PRN

## 2012-12-05 MED ORDER — PROCHLORPERAZINE EDISYLATE 5 MG/ML IJ SOLN: 10.0000 mg | Freq: Once | INTRAMUSCULAR | Status: AC

## 2012-12-05 NOTE — Progress Notes (Signed)
Inpatient Diabetes Program Recommendations  AACE/ADA: New Consensus Statement on Inpatient Glycemic Control (2013)  Target Ranges:  Prepandial:   less than 140 mg/dL      Peak postprandial:   less than 180 mg/dL (1-2 hours)      Critically ill patients:  140 - 180 mg/dL   Reason for Visit: Hyperglycemia  Results for SERJIO, DEUPREE (MRN [id number]) as of 12/05/2012 17:14  Ref. Range 12/04/2012 07:53 12/04/2012 11:54 12/04/2012 16:29 12/04/2012 21:39 12/05/2012 07:34  Glucose-Capillary Latest Range: 70-99 mg/dL 086 (H) 578 (H) 469 (H) 142 (H) 190 (H)  Results for AVREY, HYSER (MRN [id number]) as of 12/05/2012 17:14  Ref. Range 12/05/2012 04:28  Glucose Latest Range: 70-99 mg/dL 244 (H)   Needs basal insulin.  Inpatient Diabetes Program Recommendations Insulin - Basal: Add Lantus 15 units QD  Note: Pt on 70/30 55 units bid at home.   Will follow. Thank you. Ailene Ards, RD, LDN, CDE Inpatient Diabetes Coordinator [phone]

## 2012-12-05 NOTE — Progress Notes (Signed)
Patient ID: Phillip [name], male   DOB: 10-07-1932, 79 y.o.   MRN: [id number] [facility] PROGRESS NOTE  Phillip [name] WUJ:[id number] DOB: 10-17-1932 DOA: 12/03/2012 PCP: Louie Boston, MD  Brief narrative: Pt is very pleasant 79 years old male recently diagnosed with extensive stage small cell lung cancer with bilateral pulmonary nodules, mediastinal and supraclavicular lymphadenopathy as well as liver and bone metastasis. He was transferred from [facility] to [facility] for continuation of care for his medical condition. He currently reports feeling better, denies chest pain or shortness of breath, he reports pain is well controlled with current regimen. He is more sad about his condition and explains his concerns. He denies fevers, chills, no specific abdominal or urinary concerns. Last bowel movement was several days prior to this admission.   Principal Problem:   Small cell lung cancer - treatment per oncologist, appreciate assistance  Active Problems:   DM - will add Novolog 70/30 but will lower the dose from his home dose as oral intake is not adequate - will also continue SSI for now   HYPERTENSION - slightly above target goal, discussed with pt and wife and they say his BP at home is stable - will continue to monitor closely and we may need to add hydralazine PRN    PULMONARY FIBROSIS ILD POST INFLAMMATORY CHRONIC - maintaining oxygen saturations at target range    Protein-calorie malnutrition, severe - in the setting of chronic illness - encouraged PO intake - nutrition consultation    Hyponatremia - likely secondary to pre renal etiology, IVF provided and pt responding well - sodium level is improving    Transaminitis - likely secondary to metastatic liver disease as noted on the abdominal US  Consultants:  Oncology   Procedures/Studies: US Abdomen Complete  12/05/2012  Cholelithiasis.  Multiple liver masses as seen on the recent CT scan worrisome for metastatic  disease.  Pancreas and portions of the aorta were obscured.   Antibiotics:  None  Code Status: Full Family Communication: Pt and wife at bedside Disposition Plan: Home when medically stable  HPI/Subjective: No events overnight.   Objective: Filed Vitals:   12/04/12 0716 12/04/12 1331 12/04/12 2104 12/05/12 0510  BP: 151/85 145/85 165/75 171/72  Pulse:  95 70 66  Temp:  98.1 F (36.7 C) 99.3 F (37.4 C) 97.4 F (36.3 C)  TempSrc:  Oral Oral Oral  Resp:  20 20 24   Height:      Weight:      SpO2:  96% 98% 96%    Intake/Output Summary (Last 24 hours) at 12/05/12 0926 Last data filed at 12/05/12 0029  Gross per 24 hour  Intake     60 ml  Output    600 ml  Net   -540 ml    Exam:   General:  Pt is alert, follows commands appropriately, not in acute distress  Cardiovascular: Regular rate and rhythm, S1/S2, no murmurs, no rubs, no gallops  Respiratory: Clear to auscultation bilaterally, no wheezing, decreased breath sounds at bases   Abdomen: Soft, non tender, non distended, bowel sounds present, no guarding  Extremities: No edema, pulses DP and PT palpable bilaterally  Neuro: Grossly nonfocal  Data Reviewed: Basic Metabolic Panel:  Recent Labs Lab 11/30/12 1105 12/03/12 1857 12/04/12 0515 12/05/12 0428  NA  --  126* 125* 130*  K  --  4.5 4.2 4.8  CL  --  91* 92* 97  CO2  --  26 24 24   GLUCOSE  --  196* 175* 226*  BUN  --  17 19 26*  CREATININE 1.31 0.98 0.99 1.08  CALCIUM  --  8.8 8.7 8.6  MG  --  1.9  --   --   PHOS  --  2.7  --   --    Liver Function Tests:  Recent Labs Lab 12/03/12 1857 12/05/12 0428  AST 164* 118*  ALT 85* 79*  ALKPHOS 548* 480*  BILITOT 2.2* 2.2*  PROT 8.1 6.7  ALBUMIN 2.4* 1.9*   CBC:  Recent Labs Lab 12/03/12 1857 12/04/12 0515 12/05/12 0428  WBC 10.6* 11.0* 7.9  HGB 15.8 14.7 13.6  HCT 45.0 43.4 39.4  MCV 93.9 94.1 94.7  PLT 147* 125* 106*   CBG:  Recent Labs Lab 12/04/12 0753 12/04/12 1154  12/04/12 1629 12/04/12 2139 12/05/12 0734  GLUCAP 206* 235* 161* 142* 190*    No results found for this or any previous visit (from the past 240 hour(s)).   Scheduled Meds: . sodium chloride   Intravenous Once  . sodium chloride   Intravenous Once  . allopurinol  200 mg Oral Daily  . aspirin EC  81 mg Oral Daily  . chlorproMAZINE  25 mg Oral TID  . enoxaparin (LOVENOX) injection  40 mg Subcutaneous Q24H  . etoposide  80 mg/m2 (Order-Specific) Intravenous Once  . feeding supplement  237 mL Oral BID BM  . insulin aspart  0-15 Units Subcutaneous TID WC  . insulin aspart  0-5 Units Subcutaneous QHS  . ondansetron (ZOFRAN) with dexamethasone (DECADRON) IV   Intravenous Once  . prochlorperazine  10 mg Intravenous Once  . prochlorperazine  10 mg Oral Once  . sodium chloride  3 mL Intravenous Q12H  . sodium chloride  3 mL Intravenous Q12H  . traZODone  50 mg Oral QHS   Continuous Infusions: . sodium chloride 75 mL/hr (12/04/12 2225)     Debbora Presto, MD  [facility] Pager [phone]  If 7PM-7AM, please contact night-coverage [email or web address] Password [email or web address] 12/05/2012, 9:26 AM   LOS: 2 days

## 2012-12-05 NOTE — Progress Notes (Signed)
PATIENT  TOLERATED CHEMO WITHOUT PROBLEMS, WIFE AT BEDSIDE, Rorie Delmore, RN

## 2012-12-06 ENCOUNTER — Telehealth: Payer: Self-pay | Admitting: *Deleted

## 2012-12-06 ENCOUNTER — Other Ambulatory Visit: Payer: Self-pay | Admitting: *Deleted

## 2012-12-06 ENCOUNTER — Ambulatory Visit: Payer: Medicare Other

## 2012-12-06 DIAGNOSIS — E43 Unspecified severe protein-calorie malnutrition: Secondary | ICD-10-CM

## 2012-12-06 LAB — CBC
Hemoglobin: 12.9 g/dL — ABNORMAL LOW (ref 13.0–17.0)
MCH: 33.5 pg (ref 26.0–34.0)
MCHC: 35.7 g/dL (ref 30.0–36.0)
Platelets: 126 10*3/uL — ABNORMAL LOW (ref 150–400)

## 2012-12-06 LAB — BASIC METABOLIC PANEL
Calcium: 8.3 mg/dL — ABNORMAL LOW (ref 8.4–10.5)
GFR calc Af Amer: 89 mL/min — ABNORMAL LOW (ref >90)
GFR calc non Af Amer: 76 mL/min — ABNORMAL LOW (ref >90)
Potassium: 4.5 mEq/L (ref 3.5–5.1)
Sodium: 131 mEq/L — ABNORMAL LOW (ref 135–145)

## 2012-12-06 LAB — GLUCOSE, CAPILLARY
Glucose-Capillary: 195 mg/dL — ABNORMAL HIGH (ref 70–99)
Glucose-Capillary: 240 mg/dL — ABNORMAL HIGH (ref 70–99)
Glucose-Capillary: 251 mg/dL — ABNORMAL HIGH (ref 70–99)

## 2012-12-06 MED ORDER — PROCHLORPERAZINE EDISYLATE 5 MG/ML IJ SOLN: 10.0000 mg | Freq: Once | INTRAMUSCULAR | Status: AC

## 2012-12-06 MED ORDER — PROCHLORPERAZINE MALEATE 10 MG PO TABS
10.0000 mg | ORAL_TABLET | Freq: Once | ORAL | Status: AC
  Administered 2012-12-06: 10 mg via ORAL
  Filled 2012-12-06: qty 1

## 2012-12-06 MED ORDER — SODIUM CHLORIDE 0.9 % IV SOLN
80.0000 mg/m2 | Freq: Once | INTRAVENOUS | Status: AC
  Administered 2012-12-06: 160 mg via INTRAVENOUS
  Filled 2012-12-06: qty 8

## 2012-12-06 MED ORDER — HOT PACK MISC ONCOLOGY
1.0000 | Freq: Once | Status: AC | PRN
  Filled 2012-12-06: qty 1

## 2012-12-06 MED ORDER — SODIUM CHLORIDE 0.9 % IV SOLN: Freq: Once | INTRAVENOUS | Status: DC

## 2012-12-06 MED ORDER — INSULIN GLARGINE 100 UNIT/ML ~~LOC~~ SOLN
15.0000 [IU] | Freq: Every day | SUBCUTANEOUS | Status: DC
  Administered 2012-12-06 – 2012-12-07 (×2): 15 [IU] via SUBCUTANEOUS
  Filled 2012-12-06 (×2): qty 0.15

## 2012-12-06 MED ORDER — HYDRALAZINE HCL 25 MG PO TABS
25.0000 mg | ORAL_TABLET | Freq: Four times a day (QID) | ORAL | Status: DC
  Administered 2012-12-06 – 2012-12-08 (×8): 25 mg via ORAL
  Filled 2012-12-06 (×12): qty 1

## 2012-12-06 MED ORDER — SODIUM CHLORIDE 0.9 % IV SOLN
Freq: Once | INTRAVENOUS | Status: AC
  Administered 2012-12-06: 16 mg via INTRAVENOUS
  Filled 2012-12-06: qty 8

## 2012-12-06 NOTE — Progress Notes (Addendum)
[facility] PROGRESS NOTE  Phillip [name] ZOX:[id number] DOB: 1932-09-02 DOA: 12/03/2012 PCP: Louie Boston, MD  Brief narrative: 79 years old pleasant male recently diagnosed with extensive stage small cell lung cancer with bilateral pulmonary nodules, mediastinal and supraclavicular lymphadenopathy as well as liver and bone metastasis. Patient was transferred from [facility] to [facility] for continuation of care for his medical condition. Patient reports feeling better, pain is improving. Patient expresses his sadness and concern about medical condition.   Assessment and Plan:  Principal Problem:  Small cell lung cancer  - treatment per oncologist, appreciate assistance  - received chemo today Active Problems:  Diabetes - continue Novolog 70/30 25 units BID - CBG's in past 24 hours: 345, 195, 240 - added Lantus 15 units daily per diabetic coordinator recommendations  - continue sliding scale insulin HYPERTENSION  - BP 181/74 - add hydralazine 25 mg PO Q 6 hours PULMONARY FIBROSIS ILD POST INFLAMMATORY CHRONIC  - maintaining oxygen saturations at target range  - respiratory status stable Protein-calorie malnutrition, severe  - in the setting of chronic illness  - encouraged PO intake  - nutrition consulted Hyponatremia  - likely secondary to pre renal etiology, IVF provided and pt responding well  - sodium level is improving (125 --> 131 in past 48 hours) Transaminitis  - likely secondary to metastatic liver disease as noted on the abdominal US  Anemia of chronic disease - secondary to history of malignancy and sequela of chemotherapy - hemoglobin 12.9 today - continue to monitor CBC Thrombocytopenia - likely due to sequela of chemotherapy - use SCD's for DVT prophylaxis - D/C lovenox  Danie Binder [facility] [phone]  Consultants:  Oncology  Procedures/Studies:  US Abdomen Complete 12/05/2012 Cholelithiasis. Multiple liver masses as seen on the recent CT scan  worrisome for metastatic disease. Pancreas and portions of the aorta were obscured.  Antibiotics:  None  Code Status: Full Family Communication: family at the bedside  Disposition Plan: Home when medically stable  HPI/Subjective: No events overnight.   Objective: Filed Vitals:   12/05/12 1400 12/05/12 2117 12/06/12 0010 12/06/12 0710  BP: 160/77 185/83 162/87 181/74  Pulse: 70 70  81  Temp: 97.8 F (36.6 C) 98.5 F (36.9 C)  98.1 F (36.7 C)  TempSrc: Oral Oral  Oral  Resp: 20 20  20   Height:      Weight:      SpO2: 95% 93%  90%    Intake/Output Summary (Last 24 hours) at 12/06/12 1307 Last data filed at 12/06/12 1010  Gross per 24 hour  Intake    600 ml  Output   2000 ml  Net  -1400 ml    Exam:   General:  Pt is alert, follows commands appropriately, not in acute distress  Cardiovascular: Regular rate and rhythm, S1/S2, no murmurs, no rubs, no gallops  Respiratory: diminished breath sound bilaterally, no wheezing  Abdomen: Soft, non tender, non distended, bowel sounds present, no guarding  Extremities: No edema, pulses DP and PT palpable bilaterally  Neuro: Grossly nonfocal  Data Reviewed: Basic Metabolic Panel:  Recent Labs Lab 11/30/12 1105 12/03/12 1857 12/04/12 0515 12/05/12 0428 12/06/12 0335  NA  --  126* 125* 130* 131*  K  --  4.5 4.2 4.8 4.5  CL  --  91* 92* 97 99  CO2  --  26 24 24 23   GLUCOSE  --  196* 175* 226* 171*  BUN  --  17 19 26* 32*  CREATININE 1.31 0.98  0.99 1.08 0.99  CALCIUM  --  8.8 8.7 8.6 8.3*  MG  --  1.9  --   --   --   PHOS  --  2.7  --   --   --    Liver Function Tests:  Recent Labs Lab 12/03/12 1857 12/05/12 0428  AST 164* 118*  ALT 85* 79*  ALKPHOS 548* 480*  BILITOT 2.2* 2.2*  PROT 8.1 6.7  ALBUMIN 2.4* 1.9*   CBC:  Recent Labs Lab 12/03/12 1857 12/04/12 0515 12/05/12 0428 12/06/12 0335  WBC 10.6* 11.0* 7.9 7.2  HGB 15.8 14.7 13.6 12.9*  HCT 45.0 43.4 39.4 36.1*  MCV 93.9 94.1 94.7 93.8   PLT 147* 125* 106* 126*   CBG:  Recent Labs Lab 12/05/12 0734 12/05/12 1720 12/05/12 2111 12/06/12 0757 12/06/12 1207  GLUCAP 190* 393* 345* 195* 240*    No results found for this or any previous visit (from the past 240 hour(s)).   Scheduled Meds: . allopurinol  200 mg Oral Daily  . aspirin EC  81 mg Oral Daily  . chlorproMAZINE  25 mg Oral TID  . enoxaparin (LOVENOX) injection  40 mg Subcutaneous Q24H  . etoposide  80 mg/m2 (Order-Specific) Intravenous Once  . feeding supplement  237 mL Oral BID BM  . insulin aspart  0-15 Units Subcutaneous TID WC  . insulin aspart  0-5 Units Subcutaneous QHS  . insulin aspart protamine- aspart  25 Units Subcutaneous BID WC  . insulin glargine  15 Units Subcutaneous Daily  . ondansetron (ZOFRAN) with dexamethasone (DECADRON) IV   Intravenous Once  . traZODone  50 mg Oral QHS   Continuous Infusions: . sodium chloride 100 mL/hr (12/06/12 0629)     Debbora Presto, MD  [facility] Pager [phone]  If 7PM-7AM, please contact night-coverage [email or web address] Password [email or web address] 12/06/2012, 1:07 PM   LOS: 3 days

## 2012-12-06 NOTE — Telephone Encounter (Signed)
Wife here requesting arrangements for patient to come to [facility] for all future labs and injections to save on gas and time.  Currently in room 1301-01 and if discharged would like injection projected for tomorrow be given in [place].  Also inquiring about f/o appt. on Monday of next week. Okay per Dr. Arbutus Ped if [facility] provider can accommodate, otherwise Vadhir must come to [facility].  Follow up appointment will be selected for next week and communicated to patient upon discharge.  Spoke with Annice Pih RN at the [facility] who says they do not have a provider at this time to order injections and labs.  In the future [facility] be willing to accommodate pending a longstanding provider appointment.  Mrs. Christmas notified and shared with me that he will not be discharged and is to begin chemotherapy tonight at 6:00 pm.

## 2012-12-06 NOTE — Progress Notes (Signed)
Visit with patient and wife. 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 His faith is strong, but he misses the work that he has been doing. We talked about how he might have a message in him now, coming out of his current experience and talked about how this message could be helpful to others (meaning making in his suffering). Presence; listening; prayer.

## 2012-12-06 NOTE — Progress Notes (Signed)
Subjective: The patient is seen and examined. His wife was at the bedside. She was so sleepy. He denied having any significant nausea or vomiting. He denied having any chest pain but continues to have shortness breath with exertion. He has no fever or chills. He is tolerating the first cycle of his systemic chemotherapy with carboplatin and etoposide fairly well.  Objective: Vital signs in last 24 hours: Temp:  [98.1 F (36.7 C)-98.5 F (36.9 C)] 98.1 F (36.7 C) (07/17 0710) Pulse Rate:  [70-81] 81 (07/17 0710) Resp:  [20] 20 (07/17 0710) BP: (162-185)/(74-87) 181/74 mmHg (07/17 0710) SpO2:  [90 %-93 %] 90 % (07/17 0710)  Intake/Output from previous day: 07/16 0701 - 07/17 0700 In: 600 [P.O.:600] Out: 1390 [Urine:1390] Intake/Output this shift: Total I/O In: 240 [P.O.:240] Out: 700 [Urine:700]  General appearance: alert, cooperative and no distress Resp: clear to auscultation bilaterally Cardio: regular rate and rhythm, S1, S2 normal, no murmur, click, rub or gallop GI: soft, non-tender; bowel sounds normal; no masses,  no organomegaly Extremities: extremities normal, atraumatic, no cyanosis or edema  Lab Results:   Recent Labs  12/05/12 0428 12/06/12 0335  WBC 7.9 7.2  HGB 13.6 12.9*  HCT 39.4 36.1*  PLT 106* 126*   BMET  Recent Labs  12/05/12 0428 12/06/12 0335  NA 130* 131*  K 4.8 4.5  CL 97 99  CO2 24 23  GLUCOSE 226* 171*  BUN 26* 32*  CREATININE 1.08 0.99  CALCIUM 8.6 8.3*    Studies/Results: US Abdomen Complete  12/05/2012   *RADIOLOGY REPORT*  Clinical Data:  Elevated transaminase  ABDOMINAL ULTRASOUND COMPLETE  Comparison:  12/02/2012 CT  Findings:  Gallbladder:  Innumerable gallstones are present.  The largest is 10 mm.  No wall thickening or Murphy's sign.  Common Bile Duct:  Within normal limits in caliber.  Liver: Innumerable heterogeneous liver masses are present stable compared with the recent CT of the abdomen.  IVC:  Appears normal.   Pancreas:  Obscured by overlying bowel gas.  Spleen:  Within normal limits in size and echotexture.  Right kidney:  Normal in size and parenchymal echogenicity.  No evidence of mass or hydronephrosis.  Left kidney:  Normal in size and parenchymal echogenicity.  No evidence of mass or hydronephrosis.  Abdominal Aorta:  Portions of the aorta were obscured.  Maximal visualized caliber is 1.2 cm.  IMPRESSION: Cholelithiasis.  Multiple liver masses as seen on the recent CT scan worrisome for metastatic disease.  Pancreas and portions of the aorta were obscured.   Original Report Authenticated By: Jolaine Click, M.D.    Medications: I have reviewed the patient's current medications.   Assessment/Plan: This is a very pleasant 79 years old white male who is recently diagnosed with extensive stage small cell lung cancer currently undergoing systemic chemotherapy with carboplatin and etoposide. He is tolerating his treatment fairly well with no significant adverse effects.  I will arrange for the patient to receive Neulasta injection the day after discharge. I will arrange for him to have a followup appointment with me in one week for evaluation and management any adverse effect of his treatment.  LOS: 3 days    Nabil Bubolz K. 12/06/2012

## 2012-12-07 ENCOUNTER — Other Ambulatory Visit: Payer: Self-pay | Admitting: *Deleted

## 2012-12-07 ENCOUNTER — Telehealth: Payer: Self-pay | Admitting: Internal Medicine

## 2012-12-07 ENCOUNTER — Other Ambulatory Visit: Payer: Self-pay | Admitting: Certified Registered Nurse Anesthetist

## 2012-12-07 ENCOUNTER — Telehealth: Payer: Self-pay | Admitting: Medical Oncology

## 2012-12-07 ENCOUNTER — Ambulatory Visit: Payer: Medicare Other

## 2012-12-07 LAB — BASIC METABOLIC PANEL
BUN: 33 mg/dL — ABNORMAL HIGH (ref 6–23)
BUN: 35 mg/dL — ABNORMAL HIGH (ref 6–23)
CO2: 21 mEq/L (ref 19–32)
Calcium: 8.4 mg/dL (ref 8.4–10.5)
Chloride: 97 mEq/L (ref 96–112)
Creatinine, Ser: 0.95 mg/dL (ref 0.50–1.35)
GFR calc Af Amer: 89 mL/min — ABNORMAL LOW (ref >90)
GFR calc non Af Amer: 77 mL/min — ABNORMAL LOW (ref >90)
Glucose, Bld: 234 mg/dL — ABNORMAL HIGH (ref 70–99)
Sodium: 130 mEq/L — ABNORMAL LOW (ref 135–145)

## 2012-12-07 LAB — CBC
HCT: 39.2 % (ref 39.0–52.0)
Hemoglobin: 13.2 g/dL (ref 13.0–17.0)
MCH: 31.7 pg (ref 26.0–34.0)
MCHC: 33.7 g/dL (ref 30.0–36.0)
RDW: 16.3 % — ABNORMAL HIGH (ref 11.5–15.5)

## 2012-12-07 LAB — GLUCOSE, CAPILLARY

## 2012-12-07 MED ORDER — INSULIN ASPART PROT & ASPART (70-30 MIX) 100 UNIT/ML ~~LOC~~ SUSP
30.0000 [IU] | Freq: Two times a day (BID) | SUBCUTANEOUS | Status: DC
  Administered 2012-12-07 – 2012-12-08 (×2): 30 [IU] via SUBCUTANEOUS

## 2012-12-07 MED ORDER — CHLORPROMAZINE HCL 25 MG PO TABS
25.0000 mg | ORAL_TABLET | Freq: Four times a day (QID) | ORAL | Status: DC | PRN
  Administered 2012-12-07: 25 mg via ORAL
  Filled 2012-12-07: qty 1

## 2012-12-07 MED ORDER — CHLORPROMAZINE HCL 100 MG PO TABS
100.0000 mg | ORAL_TABLET | Freq: Four times a day (QID) | ORAL | Status: DC | PRN
  Administered 2012-12-07: 100 mg via ORAL
  Filled 2012-12-07: qty 1

## 2012-12-07 NOTE — Evaluation (Signed)
Physical Therapy Evaluation Patient Details Name: Phillip [name] MRN: [id number] DOB: 1933/01/12 Today's Date: 12/07/2012 Time: 2956-2130 PT Time Calculation (min): 36 min  PT Assessment / Plan / Recommendation History of Present Illness  79 years old pleasant male recently diagnosed with extensive stage small cell lung cancer with bilateral pulmonary nodules, mediastinal and supraclavicular lymphadenopathy as well as liver and bone metastasis. Pt admitted 12/03/12 for treatment of chest pain  Clinical Impression  Pt presents with deconditioning, but he is motivated and able to work with PT.  He should be OK to d/c to home with wife and HHPT after session tomorrow to practice steps .     PT Assessment  Patient needs continued PT services    Follow Up Recommendations  Home health PT    Does the patient have the potential to tolerate intense rehabilitation      Barriers to Discharge        Equipment Recommendations  None recommended by PT    Recommendations for Other Services     Frequency Min 3X/week    Precautions / Restrictions     Pertinent Vitals/Pain No c/o pain      Mobility  Bed Mobility Bed Mobility: Supine to Sit Supine to Sit: 4: Min assist Transfers Transfers: Sit to Stand;Stand to Sit Sit to Stand: 4: Min assist Stand to Sit: 4: Min assist Details for Transfer Assistance: cues to contsistently use arms to  push up on armrests Ambulation/Gait Ambulation/Gait Assistance: 4: Min assist Ambulation Distance (Feet): 100 Feet (x 2 trials) Assistive device: Rolling walker Ambulation/Gait Assistance Details: Pt needs assist for stability and multimodal cues to stand erect during gait Gait Pattern: Step-through pattern;Decreased step length - right;Decreased step length - left Gait velocity: decreased General Gait Details: Pt appears deconditioned, but is able to ambulate with RW.  O2 sats at 94% with ambulatin on 2 L O2 Stairs: No Wheelchair Mobility Wheelchair  Mobility: No    Exercises Other Exercises Other Exercises: Repeated sit to stand x2 with isometrics of glutes and quads in standing to strenthen  Other Exercises: Encouraged pt to do short bursts of activity througout the day to increase endurance   PT Diagnosis: Difficulty walking;Generalized weakness  PT Problem List: Decreased strength;Decreased activity tolerance;Decreased balance PT Treatment Interventions: Gait training;Stair training;Functional mobility training;Therapeutic exercise     PT Goals(Current goals can be found in the care plan section) Acute Rehab PT Goals Patient Stated Goal: to go home tomorrow PT Goal Formulation: With patient/family Time For Goal Achievement: 12/21/12 Potential to Achieve Goals: Good  Visit Information  Last PT Received On: 12/07/12 Assistance Needed: +1 History of Present Illness: 79 years old pleasant male recently diagnosed with extensive stage small cell lung cancer with bilateral pulmonary nodules, mediastinal and supraclavicular lymphadenopathy as well as liver and bone metastasis. Pt admitted 12/03/12 for treatment of chest pain       Prior Functioning  Home Living Family/patient expects to be discharged to:: Private residence Living Arrangements: Spouse/significant other Available Help at Discharge: Family Type of Home: House Home Access: Stairs to enter [employer] of Steps: 6 steps, landing, 5 steps Entrance Stairs-Rails: Right Home Layout: One level Home Equipment: Walker - 2 wheels;Cane - single point;Bedside commode Additional Comments: Pt reports he has home health assist 2x a week He reports he uses O2 at night Prior Function Level of Independence: Independent with assistive device(s) Communication Communication: No difficulties    Cognition  Cognition Arousal/Alertness: Awake/alert Behavior During Therapy: WFL for tasks  assessed/performed Overall Cognitive Status: Within Functional Limits for tasks  assessed    Extremity/Trunk Assessment Lower Extremity Assessment Lower Extremity Assessment: Generalized weakness   Balance Balance Balance Assessed: Yes Static Sitting Balance Static Sitting - Balance Support: Bilateral upper extremity supported Static Sitting - Level of Assistance: 5: Stand by assistance Static Standing Balance Static Standing - Balance Support: Bilateral upper extremity supported;During functional activity Static Standing - Level of Assistance: 5: Stand by assistance  End of Session PT - End of Session Equipment Utilized During Treatment: Gait belt;Oxygen Activity Tolerance: Patient limited by fatigue Patient left: in chair;with family/visitor present Nurse Communication: Mobility status  GP    Bayard Hugger. Brown,PT [phone] 12/07/2012, 3:43 PM

## 2012-12-07 NOTE — Progress Notes (Signed)
Patient ID: Phillip [name], male   DOB: [birth year], 79 y.o.   MRN: [id number] [facility] PROGRESS NOTE  MUJTABA BOLLIG WUJ:[id number] DOB: [birth year] DOA: 12/03/2012 PCP: Louie Boston, MD  Brief narrative:  79 years old pleasant male recently diagnosed with extensive stage small cell lung cancer with bilateral pulmonary nodules, mediastinal and supraclavicular lymphadenopathy as well as liver and bone metastasis. Patient was transferred from [facility] to [facility] for continuation of care for his medical condition. Patient reports feeling better, pain is improving. Patient expresses his sadness and concern about medical condition.   Assessment and Plan:  Principal Problem:  Small cell lung cancer  - treatment per oncologist, appreciate assistance  - received chemo yesterday and tolerated fairly well - will have outpatient follow up with oncologist arranged  Active Problems:  Diabetes  - continue Novolog 70/30 25 units BID but will increase the dose to 30 units - no need to have Lantus on board in addition to Novolog, will simply increase the dose of Novolog as noted above  - continue sliding scale insulin  HYPERTENSION  - continue hydralazine 25 mg PO Q 6 hours  PULMONARY FIBROSIS ILD POST INFLAMMATORY CHRONIC  - maintaining oxygen saturations at target range  - respiratory status stable with mild crackles on exam - will discontinue IVF  Protein-calorie malnutrition, severe  - in the setting of chronic illness  - encouraged PO intake  - nutrition consulted  Hyponatremia  - likely secondary to pre renal etiology, IVF provided and pt responding well  - sodium level is overall stable Transaminitis  - likely secondary to metastatic liver disease as noted on the abdominal US  Anemia of chronic disease  - secondary to history of malignancy and sequela of chemotherapy  - hemoglobin overall stable  - continue to monitor CBC  Thrombocytopenia  - likely due to sequela of  chemotherapy  - use SCD's for DVT prophylaxis   Danie Binder  [facility]  [phone]   Consultants:  Oncology  Procedures/Studies:  US Abdomen Complete 12/05/2012 Cholelithiasis. Multiple liver masses as seen on the recent CT scan worrisome for metastatic disease. Pancreas and portions of the aorta were obscured.  Antibiotics:  None  Code Status: Full  Family Communication: family at the bedside  Disposition Plan: Home when medically stable  HPI/Subjective: No events overnight.   Objective: Filed Vitals:   12/06/12 2150 12/07/12 0635 12/07/12 1158 12/07/12 1425  BP: 184/81 182/86 175/82 159/79  Pulse: 66 62  67  Temp: 98.1 F (36.7 C) 97.8 F (36.6 C)  98 F (36.7 C)  TempSrc: Oral Oral    Resp: 20 16  18   Height:      Weight:      SpO2: 94% 95%  97%    Intake/Output Summary (Last 24 hours) at 12/07/12 1524 Last data filed at 12/07/12 1500  Gross per 24 hour  Intake    600 ml  Output   2251 ml  Net  -1651 ml    Exam:   General:  Pt is alert, follows commands appropriately, not in acute distress  Cardiovascular: Regular rate and rhythm, S1/S2, no murmurs, no rubs, no gallops  Respiratory: Clear to auscultation bilaterally, no wheezing, mild bibasilar crackles   Abdomen: Soft, non tender, non distended, bowel sounds present, no guarding  Extremities: No edema, pulses DP and PT palpable bilaterally  Neuro: Grossly nonfocal  Data Reviewed: Basic Metabolic Panel:  Recent Labs Lab 12/03/12 1857 12/04/12 0515 12/05/12 0428 12/06/12 0335 12/07/12  0407 12/07/12 1018  NA 126* 125* 130* 131* 130* 128*  K 4.5 4.2 4.8 4.5 5.4* 4.8  CL 91* 92* 97 99 99 97  CO2 26 24 24 23 24 21   GLUCOSE 196* 175* 226* 171* 234* 276*  BUN 17 19 26* 32* 33* 35*  CREATININE 0.98 0.99 1.08 0.99 0.98 0.95  CALCIUM 8.8 8.7 8.6 8.3* 8.4 8.3*  MG 1.9  --   --   --   --   --   PHOS 2.7  --   --   --   --   --    Liver Function Tests:  Recent Labs Lab 12/03/12 1857 12/05/12 0428   AST 164* 118*  ALT 85* 79*  ALKPHOS 548* 480*  BILITOT 2.2* 2.2*  PROT 8.1 6.7  ALBUMIN 2.4* 1.9*   CBC:  Recent Labs Lab 12/03/12 1857 12/04/12 0515 12/05/12 0428 12/06/12 0335 12/07/12 0407  WBC 10.6* 11.0* 7.9 7.2 4.9  HGB 15.8 14.7 13.6 12.9* 13.2  HCT 45.0 43.4 39.4 36.1* 39.2  MCV 93.9 94.1 94.7 93.8 94.2  PLT 147* 125* 106* 126* 138*   CBG:  Recent Labs Lab 12/06/12 1207 12/06/12 1704 12/06/12 2048 12/07/12 0805 12/07/12 1233  GLUCAP 240* 251* 284* 225* 283*   Scheduled Meds: . sodium chloride   Intravenous Once  . sodium chloride   Intravenous Once  . allopurinol  200 mg Oral Daily  . aspirin EC  81 mg Oral Daily  . chlorproMAZINE  25 mg Oral TID  . feeding supplement  237 mL Oral BID BM  . hydrALAZINE  25 mg Oral Q6H  . insulin aspart  0-15 Units Subcutaneous TID WC  . insulin aspart  0-5 Units Subcutaneous QHS  . insulin aspart protamine- aspart  25 Units Subcutaneous BID WC  . insulin glargine  15 Units Subcutaneous Daily  . sodium chloride  3 mL Intravenous Q12H  . sodium chloride  3 mL Intravenous Q12H  . traZODone  50 mg Oral QHS   Continuous Infusions:   Debbora Presto, MD  [facility] Pager [phone]  If 7PM-7AM, please contact night-coverage [email or web address] Password [email or web address] 12/07/2012, 3:24 PM   LOS: 4 days

## 2012-12-07 NOTE — Telephone Encounter (Signed)
gv and printed appt sched and avs for pt  °

## 2012-12-07 NOTE — Progress Notes (Signed)
Inpatient Diabetes Program Recommendations  AACE/ADA: New Consensus Statement on Inpatient Glycemic Control (2013)  Target Ranges:  Prepandial:   less than 140 mg/dL      Peak postprandial:   less than 180 mg/dL (1-2 hours)      Critically ill patients:  140 - 180 mg/dL   Reason for Visit: Hyperglycemia Results for Phillip, [name] (MRN [id number]) as of 12/07/2012 12:07  Ref. Range 12/06/2012 07:57 12/06/2012 12:07 12/06/2012 17:04 12/06/2012 20:48 12/07/2012 08:05  Glucose-Capillary Latest Range: 70-99 mg/dL 409 (H) 811 (H) 914 (H) 284 (H) 225 (H)  Results for Phillip, [name] (MRN [id number]) as of 12/07/2012 12:07  Ref. Range 12/06/2012 03:35 12/07/2012 04:07 12/07/2012 10:18  Glucose Latest Range: 70-99 mg/dL 086 (H) 578 (H) 469 (H)   Eating much better - 75% of breakfast.  Inpatient Diabetes Program Recommendations Insulin - Basal: D/C Lantus and increase 70/30 to 55 units bid (home dose)  Note: Will continue to follow.  Thank you. Ailene Ards, RD, LDN, CDE Inpatient Diabetes Coordinator [phone]

## 2012-12-07 NOTE — Telephone Encounter (Signed)
s.w. pt wife and advised on today inj...she confirmed that pt had tx in hospital per tx plan...inj due today per tx plan.Marland KitchenMarland Kitchen

## 2012-12-07 NOTE — Telephone Encounter (Signed)
[facility] outpt lab can draw lab every Monday, so I faxed order [phone]) , last dictation and insurance information. Phillip [name] is aware that I did this.  Pt scheduled for outpt Neulasta tomorrow at cancer center at 1 pm.

## 2012-12-08 ENCOUNTER — Telehealth (HOSPITAL_COMMUNITY): Payer: Self-pay

## 2012-12-08 ENCOUNTER — Ambulatory Visit (HOSPITAL_BASED_OUTPATIENT_CLINIC_OR_DEPARTMENT_OTHER): Payer: Medicare Other

## 2012-12-08 VITALS — BP 130/71 | HR 79 | Temp 97.9°F | Resp 20

## 2012-12-08 DIAGNOSIS — C770 Secondary and unspecified malignant neoplasm of lymph nodes of head, face and neck: Secondary | ICD-10-CM

## 2012-12-08 DIAGNOSIS — C7951 Secondary malignant neoplasm of bone: Secondary | ICD-10-CM

## 2012-12-08 DIAGNOSIS — R05 Cough: Secondary | ICD-10-CM

## 2012-12-08 DIAGNOSIS — C3490 Malignant neoplasm of unspecified part of unspecified bronchus or lung: Secondary | ICD-10-CM

## 2012-12-08 DIAGNOSIS — C787 Secondary malignant neoplasm of liver and intrahepatic bile duct: Secondary | ICD-10-CM

## 2012-12-08 LAB — GLUCOSE, CAPILLARY: Glucose-Capillary: 54 mg/dL — ABNORMAL LOW (ref 70–99)

## 2012-12-08 MED ORDER — PEGFILGRASTIM INJECTION 6 MG/0.6ML
6.0000 mg | Freq: Once | SUBCUTANEOUS | Status: AC
  Administered 2012-12-08: 6 mg via SUBCUTANEOUS

## 2012-12-08 MED ORDER — HYDROCODONE-ACETAMINOPHEN 10-325 MG PO TABS: 1.0000 | ORAL_TABLET | Freq: Four times a day (QID) | ORAL | Status: DC | PRN

## 2012-12-08 MED ORDER — ALBUTEROL SULFATE (5 MG/ML) 0.5% IN NEBU: 2.5000 mg | INHALATION_SOLUTION | Freq: Four times a day (QID) | RESPIRATORY_TRACT | Status: DC | PRN

## 2012-12-08 MED ORDER — LORAZEPAM 1 MG PO TABS: 1.0000 mg | ORAL_TABLET | Freq: Three times a day (TID) | ORAL | Status: DC | PRN

## 2012-12-08 NOTE — Discharge Summary (Signed)
Physician Discharge Summary  Phillip [name] ZOX:[id number] DOB: 11/20/1932 DOA: 12/03/2012  PCP: Louie Boston, MD  Admit date: 12/03/2012 Discharge date: 12/08/2012  Recommendations for Outpatient Follow-up:  1. Pt will need to follow up with PCP in 2-3 weeks post discharge 2. Please obtain BMP to evaluate electrolytes and kidney function, sodium level  3. Please also check CBC to evaluate Hg and Hct levels, WBC and platelets count   Discharge Diagnoses: Small cell lung cancer  Principal Problem:   Small cell lung cancer Active Problems:   DM   HYPERTENSION   PULMONARY FIBROSIS ILD POST INFLAMMATORY CHRONIC   Protein-calorie malnutrition, severe   Hyponatremia   Abnormal transaminases  Discharge Condition: Stable  Diet recommendation: Heart healthy diet discussed in details   Brief narrative:  79 years old pleasant male recently diagnosed with extensive stage small cell lung cancer with bilateral pulmonary nodules, mediastinal and supraclavicular lymphadenopathy as well as liver and bone metastasis. Patient was transferred from [facility] to [facility] for continuation of care for his medical condition. Patient reports feeling better, pain is improving. Patient expresses his sadness and concern about medical condition.   Assessment and Plan:  Principal Problem:  Small cell lung cancer  - treatment per oncologist, appreciate assistance  - received chemo starting 12/06/2012 and tolerated fairly well  - will have outpatient follow up with oncologist arranged  Active Problems:  Diabetes  - continue Novolog 70/30 25 units BID per home medical regimen  - continued sliding scale insulin while inpatient but not needed upon discharge  HYPERTENSION  - continue hydralazine 25 mg PO Q 6 hours PRN while inpatient, no need to continue upon discharge as wife explains pt has no problems with BP control at home  - wife wants to hold of on any additional medications for now  PULMONARY FIBROSIS  ILD POST INFLAMMATORY CHRONIC  - maintaining oxygen saturations at target range  - respiratory status stable Protein-calorie malnutrition, severe  - in the setting of chronic illness  - encouraged PO intake  Hyponatremia  - likely secondary to pre renal etiology, IVF provided and pt responding well  - sodium level is overall stable  Transaminitis  - likely secondary to metastatic liver disease as noted on the abdominal US  Anemia of chronic disease  - secondary to history of malignancy and sequela of chemotherapy  - hemoglobin overall stable  Thrombocytopenia  - likely due to sequela of chemotherapy  - use SCD's for DVT prophylaxis while inpatient   Danie Binder  [facility]  [phone]   Consultants:  Oncology  Procedures/Studies:  US Abdomen Complete 12/05/2012 Cholelithiasis. Multiple liver masses as seen on the recent CT scan worrisome for metastatic disease. Pancreas and portions of the aorta were obscured.  Antibiotics:  None  Code Status: Full  Family Communication: wife at the bedside   Discharge Exam: Filed Vitals:   12/08/12 0530  BP: 140/71  Pulse: 69  Temp: 97.8 F (36.6 C)  Resp: 24   Filed Vitals:   12/07/12 1158 12/07/12 1425 12/07/12 2059 12/08/12 0530  BP: 175/82 159/79 143/80 140/71  Pulse:  67 79 69  Temp:  98 F (36.7 C) 97.5 F (36.4 C) 97.8 F (36.6 C)  TempSrc:   Oral Oral  Resp:  18 20 24   Height:      Weight:      SpO2:  97% 96% 96%    General: Pt is alert, follows commands appropriately, not in acute distress Cardiovascular: Regular rate and  rhythm, S1/S2 +, no murmurs, no rubs, no gallops Respiratory: Clear to auscultation bilaterally, no wheezing, no crackles, no rhonchi Abdominal: Soft, non tender, non distended, bowel sounds +, no guarding Extremities: no edema, no cyanosis, pulses palpable bilaterally DP and PT Neuro: Grossly nonfocal  Discharge Instructions  Discharge Orders   Future Appointments Provider Department Dept Phone    12/08/2012 1:45 PM [facility] Inj Nurse [place] [facility] [phone]   12/10/2012 12:15 PM [facility] Only [place] [facility] [phone]   12/17/2012 1:00 PM [facility] Only [place] [facility] [phone]   12/24/2012 12:45 PM [facility] Only [place] [facility] [phone]   12/24/2012 1:00 PM [facility] E15 [place] [facility] [phone]   12/25/2012 1:30 PM [facility] D12 [place] [facility] [phone]   12/26/2012 2:00 PM [facility] D13 [place] [facility] [phone]   12/27/2012 12:45 PM [facility] Flush Nurse [place] [facility] [phone]   12/31/2012 1:30 PM [facility] [place] [facility] [place] [facility] [phone]   01/14/2013 1:00 PM [facility] [phone]   01/14/2013 2:00 PM [name], MD [facility] [phone]   Future Orders Complete By Expires     Diet - low sodium heart healthy  As directed     Increase activity slowly  As directed     TREATMENT CONDITIONS  As directed     Comments:      Notify the MD for the following lab values: ANC < 1500, PLT < 100K, Hemoglobin < 8.5, Creatinine > 1.5, urine output < 200 ml prior to cisplatin.  If labs are abnormal OR no lab data is available, MD must be notified and order obtained to begin chemotherapy.        Medication List    STOP taking these medications       ALPRAZolam 0.5 MG tablet  Commonly known as:  XANAX      TAKE these medications       albuterol (5 MG/ML) 0.5% nebulizer solution  Commonly known as:  PROVENTIL  Take 0.5 mLs (2.5 mg total) by nebulization every 6 (six) hours as needed for wheezing or shortness of breath.     allopurinol 100 MG tablet  Commonly known as:  ZYLOPRIM  Take 200 mg by mouth daily.     aspirin 81 MG tablet  Take 81 mg by mouth  daily.     bumetanide 1 MG tablet  Commonly known as:  BUMEX  Take 1 mg by mouth daily.     chlorproMAZINE 25 MG tablet  Commonly known as:  THORAZINE  Take 25 mg by mouth 3 (three) times daily as needed (hiccups.).     colchicine 0.6 MG tablet  Take 0.6 mg by mouth 2 (two) times daily as needed (gout).     diazepam 5 MG tablet  Commonly known as:  VALIUM  Take 5 mg by mouth at bedtime as needed for sleep.     GLUCOSAMINE CHONDROITIN COMPLX PO  Take 1 capsule by mouth 3 (three) times daily.     HYDROcodone-acetaminophen 10-325 MG per tablet  Commonly known as:  NORCO  Take 1 tablet by mouth every 6 (six) hours as needed for pain.     lidocaine-prilocaine cream  Commonly known as:  EMLA  Apply topically as needed.     LORazepam 1 MG tablet  Commonly  known as:  ATIVAN  Take 1 tablet (1 mg total) by mouth every 8 (eight) hours as needed for anxiety.     losartan 100 MG tablet  Commonly known as:  COZAAR  Take 100 mg by mouth daily.     MAGNESIUM-CHELATED ZINC PO  Take 1 tablet by mouth 3 (three) times daily.     methylPREDNISolone 4 MG tablet  Commonly known as:  MEDROL DOSEPAK  Take by mouth. follow package directions     multivitamin with minerals Tabs  Take 1 tablet by mouth daily.     NOVOLIN 70/30 RELION [place]  Inject 55 Units into the skin 2 (two) times daily.     penicillin v potassium 500 MG tablet  Commonly known as:  VEETID  Take 500 mg by mouth 4 (four) times daily. For 7 days.     prochlorperazine 10 MG tablet  Commonly known as:  COMPAZINE  Take 1 tablet (10 mg total) by mouth every 6 (six) hours as needed.           Follow-up Information   Follow up with TAPPER,DAVID B, MD In 2 weeks.   Contact information:   [address] [place] [address] [phone]       Follow up with Debbora Presto, MD. (As needed if symptoms worsen)    Contact information:   [address] [place] [address] [phone] (cell)        The results of significant diagnostics from this hospitalization (including imaging, microbiology, ancillary and laboratory) are listed below for reference.     Microbiology: No results found for this or any previous visit (from the past 240 hour(s)).   Labs: Basic Metabolic Panel:  Recent Labs Lab 12/03/12 1857 12/04/12 0515 12/05/12 0428 12/06/12 0335 12/07/12 0407 12/07/12 1018  NA 126* 125* 130* 131* 130* 128*  K 4.5 4.2 4.8 4.5 5.4* 4.8  CL 91* 92* 97 99 99 97  CO2 26 24 24 23 24 21   GLUCOSE 196* 175* 226* 171* 234* 276*  BUN 17 19 26* 32* 33* 35*  CREATININE 0.98 0.99 1.08 0.99 0.98 0.95  CALCIUM 8.8 8.7 8.6 8.3* 8.4 8.3*  MG 1.9  --   --   --   --   --   PHOS 2.7  --   --   --   --   --    Liver Function Tests:  Recent Labs Lab 12/03/12 1857 12/05/12 0428  AST 164* 118*  ALT 85* 79*  ALKPHOS 548* 480*  BILITOT 2.2* 2.2*  PROT 8.1 6.7  ALBUMIN 2.4* 1.9*   CBC:  Recent Labs Lab 12/03/12 1857 12/04/12 0515 12/05/12 0428 12/06/12 0335 12/07/12 0407  WBC 10.6* 11.0* 7.9 7.2 4.9  HGB 15.8 14.7 13.6 12.9* 13.2  HCT 45.0 43.4 39.4 36.1* 39.2  MCV 93.9 94.1 94.7 93.8 94.2  PLT 147* 125* 106* 126* 138*   CBG:  Recent Labs Lab 12/07/12 1233 12/07/12 1724 12/07/12 2209 12/08/12 0729 12/08/12 0752  GLUCAP 283* 192* 223* 54* 84   SIGNED: Time coordinating discharge: Over 30 minutes  Debbora Presto, MD  [facility] 12/08/2012, 8:08 AM Pager [phone]  If 7PM-7AM, please contact night-coverage [email or web address] Password [email or web address]

## 2012-12-08 NOTE — Progress Notes (Signed)
Pt D/C home , alert and oriented no new complains. D/C instructions and medication administration instructions done. Patient/family member (wife) verbalizes understanding.

## 2012-12-08 NOTE — Telephone Encounter (Signed)
Case Mangement: This CM spoke with Phillip [name] who stated if they decide to do home PT [facility] has attempted to call them but they have not made a decision yet.  No needs at this time   Karoline Caldwell, RN, BSN, [place]   [phone]

## 2012-12-08 NOTE — Progress Notes (Signed)
Physical Therapy Treatment Patient Details Name: Phillip [name] MRN: [id number] DOB: 1932-06-23 Today's Date: 12/08/2012 Time: 4098-1191 PT Time Calculation (min): 17 min  PT Assessment / Plan / Recommendation  PT Comments   Pt is able to ambulate on steps on RA.  Wife and pt understand to have a chair at landing and O2 nearby if needed. Pt is ready to d/c to home with wife and HHPT  Follow Up Recommendations  Home health PT     Does the patient have the potential to tolerate intense rehabilitation     Barriers to Discharge        Equipment Recommendations  None recommended by PT    Recommendations for Other Services    Frequency     Progress towards PT Goals Progress towards PT goals: Progressing toward goals  Plan Current plan remains appropriate    Precautions / Restrictions     Pertinent Vitals/Pain O2 sats 94% with increased distance ambulation on 2L    Mobility  Bed Mobility Bed Mobility: Supine to Sit Supine to Sit: 4: Min guard Transfers Transfers: Sit to Stand;Stand to Sit Sit to Stand: 4: Min guard Stand to Sit: 4: Min guard Details for Transfer Assistance: cues to contsistently use arms to  push up on armrests Ambulation/Gait Ambulation/Gait Assistance: 4: Min guard Ambulation Distance (Feet): 75 Feet Assistive device: Rolling walker Ambulation/Gait Assistance Details: cues for posture Gait Pattern: Step-through pattern;Decreased step length - right;Decreased step length - left Gait velocity: decreased General Gait Details: Pt appears deconditioned, but is able to ambulate with RW.  O2 sats at 94% with ambulatin on 2 L O2 Stairs: Yes Stairs Assistance: 5: Supervision Stairs Assistance Details (indicate cue type and reason): cues to direct pt to use step to pattern, but dhe consistently uses alternating pattern.. O2 sats 91% after practicing steps on RA Stair Management Technique: Two rails;Alternating pattern Wheelchair Mobility Wheelchair Mobility: No    Exercises     PT Diagnosis:    PT Problem List:   PT Treatment Interventions:     PT Goals (current goals can now be found in the care plan section)    Visit Information  Last PT Received On: 12/08/12 History of Present Illness: 79 years old pleasant male recently diagnosed with extensive stage small cell lung cancer with bilateral pulmonary nodules, mediastinal and supraclavicular lymphadenopathy as well as liver and bone metastasis. Pt admitted 12/03/12 for treatment of chest pain    Subjective Data      Cognition  Cognition Arousal/Alertness: Awake/alert Behavior During Therapy: [facility] for tasks assessed/performed Overall Cognitive Status: Within Functional Limits for tasks assessed    Balance     End of Session PT - End of Session Equipment Utilized During Treatment: Gait belt;Oxygen Activity Tolerance: Patient limited by fatigue Patient left: in chair;with family/visitor present Nurse Communication: Mobility status   GP     Donnetta Hail 12/08/2012, 9:20 AM

## 2012-12-10 ENCOUNTER — Telehealth: Payer: Self-pay | Admitting: Medical Oncology

## 2012-12-10 ENCOUNTER — Other Ambulatory Visit: Payer: Medicare Other

## 2012-12-10 NOTE — Telephone Encounter (Signed)
reviewed CBC with wife,

## 2012-12-11 ENCOUNTER — Other Ambulatory Visit: Payer: Self-pay | Admitting: Certified Registered Nurse Anesthetist

## 2012-12-11 DIAGNOSIS — K5900 Constipation, unspecified: Secondary | ICD-10-CM

## 2012-12-17 ENCOUNTER — Other Ambulatory Visit: Payer: Self-pay | Admitting: *Deleted

## 2012-12-17 ENCOUNTER — Other Ambulatory Visit: Payer: Medicare Other

## 2012-12-18 ENCOUNTER — Encounter: Payer: Self-pay | Admitting: Internal Medicine

## 2012-12-18 ENCOUNTER — Ambulatory Visit (HOSPITAL_BASED_OUTPATIENT_CLINIC_OR_DEPARTMENT_OTHER): Payer: Medicare Other | Admitting: Internal Medicine

## 2012-12-18 ENCOUNTER — Telehealth: Payer: Self-pay | Admitting: *Deleted

## 2012-12-18 ENCOUNTER — Telehealth: Payer: Self-pay | Admitting: Internal Medicine

## 2012-12-18 VITALS — BP 109/68 | HR 103 | Temp 97.5°F | Resp 20 | Ht 69.0 in | Wt 173.7 lb

## 2012-12-18 DIAGNOSIS — C3491 Malignant neoplasm of unspecified part of right bronchus or lung: Secondary | ICD-10-CM

## 2012-12-18 DIAGNOSIS — C3490 Malignant neoplasm of unspecified part of unspecified bronchus or lung: Secondary | ICD-10-CM

## 2012-12-18 MED ORDER — CEPHALEXIN 500 MG PO CAPS: 500.0000 mg | ORAL_CAPSULE | Freq: Four times a day (QID) | ORAL | Status: DC

## 2012-12-18 MED ORDER — MIRTAZAPINE 30 MG PO TABS: 30.0000 mg | ORAL_TABLET | Freq: Every day | ORAL | Status: DC

## 2012-12-18 NOTE — Telephone Encounter (Signed)
Per staff message and POF I have scheduled appts.  JMW  

## 2012-12-18 NOTE — Telephone Encounter (Signed)
Gave pt appt for lab and ML, emailed Marcelino Duster regarding chemo on August 25th

## 2012-12-18 NOTE — Patient Instructions (Addendum)
I will start her on Keflex for the inflammatory process in the buttock area. Followup visit in one week.  CHEMOTHERAPY INTENT: Palliative  CURRENT # OF CHEMOTHERAPY CYCLES: 1  CURRENT ANTIEMETICS: Zofran, Compazine and dexamethasone  CURRENT SMOKING STATUS: Currently Nonsmoker, quit smoking 42 years ago.  ORAL CHEMOTHERAPY AND CONSENT: None  CURRENT BISPHOSPHONATES USE: None  PAIN MANAGEMENT: No pain  NARCOTICS INDUCED CONSTIPATION: No constipation  LIVING WILL AND CODE STATUS: Full code

## 2012-12-18 NOTE — Progress Notes (Signed)
[facility] [phone]   [phone]  OFFICE PROGRESS NOTE  TAPPER,DAVID B, MD [address] [place] [address]  DIAGNOSIS AND STAGE: Extensive stage small cell lung cancer with bilateral pulmonary nodules, mediastinal and supraclavicular lymphadenopathy as well as liver and bone metastases diagnosed in July of 2014  PRIOR THERAPY: None  CURRENT THERAPY: Systemic chemotherapy with carboplatin for AUC of 4 on day 1 and etoposide 80 mg/M2 on days 1, 2 and 3 with Neulasta support on day 4. Status post one cycle. First cycle was given on 12/04/2012 at [facility].  CHEMOTHERAPY INTENT: Palliative  CURRENT # OF CHEMOTHERAPY CYCLES: 1  CURRENT ANTIEMETICS: Zofran, Compazine and dexamethasone  CURRENT SMOKING STATUS: Currently Nonsmoker, quit smoking 42 years ago.  ORAL CHEMOTHERAPY AND CONSENT: None  CURRENT BISPHOSPHONATES USE: None  PAIN MANAGEMENT: No pain  NARCOTICS INDUCED CONSTIPATION: No constipation  LIVING WILL AND CODE STATUS: Full code  INTERVAL HISTORY: Phillip [name] 79 y.o. male returns to the clinic today for followup visit accompanied by his wife. The patient started the first cycle of his systemic chemotherapy with reduced dose carboplatin and etoposide on 12/04/2012 during his hospitalization at [facility]. He tolerated the first cycle of his treatment fairly well with no significant adverse effects. The patient denied having any significant fever or chills. He denied having any nausea or vomiting. He has no chest pain but continues to have shortness breath with exertion. The patient continues to complain of rash in the middle of his back close to the right side in addition to significant rash with inflammation at the cheek of his buttock. He also complains of lack of appetite and weight loss. He has repeat weekly CBC and comprehensive metabolic panel performed at [facility] in [facility].  MEDICAL HISTORY: Past Medical History  Diagnosis Date  . Hypertension   . Pulmonary fibrosis 2010  . Sleep apnea   . Diabetes mellitus without complication   . GERD (gastroesophageal reflux disease)   . Arthritis   . Anxiety     ALLERGIES:  has No Known Allergies.  MEDICATIONS:  Current Outpatient Prescriptions  Medication Sig Dispense Refill  . albuterol (PROVENTIL) (5 MG/ML) 0.5% nebulizer solution Take 0.5 mLs (2.5 mg total) by nebulization every 6 (six) hours as needed for wheezing or shortness of breath.  20 mL  12  . allopurinol (ZYLOPRIM) 100 MG tablet Take 200 mg by mouth daily.      Marland Kitchen aspirin 81 MG tablet Take 81 mg by mouth daily.      . bumetanide (BUMEX) 1 MG tablet Take 1 mg by mouth daily.      . chlorproMAZINE (THORAZINE) 25 MG tablet Take 25 mg by mouth 3 (three) times daily as needed (hiccups.).      Marland Kitchen colchicine 0.6 MG tablet Take 0.6 mg by mouth 2 (two) times daily as needed (gout).      . diazepam (VALIUM) 5 MG tablet Take 5 mg by mouth at bedtime as needed for sleep.      Marland Kitchen GLUCOSAMINE CHONDROITIN COMPLX PO Take 1 capsule by mouth 3 (three) times daily.      Marland Kitchen HYDROcodone-acetaminophen (NORCO) 10-325 MG per tablet Take 1 tablet by mouth every 6 (six) hours as needed for pain.  120 tablet  0  . Insulin Isophane & Regular (NOVOLIN 70/30 RELION [place]) Inject 55 Units into the skin 2 (two) times daily.      Marland Kitchen  lidocaine-prilocaine (EMLA) cream Apply topically as needed.  30 g  0  . LORazepam (ATIVAN) 1 MG tablet Take 1 tablet (1 mg total) by mouth every 8 (eight) hours as needed for anxiety.  90 tablet  0  . losartan (COZAAR) 100 MG tablet Take 100 mg by mouth daily.      . Magnesium-Zinc (MAGNESIUM-CHELATED ZINC PO) Take 1 tablet by mouth 3 (three) times daily.      . methylPREDNISolone (MEDROL DOSEPAK) 4 MG tablet Take by mouth. follow package directions      . Multiple Vitamin (MULTIVITAMIN WITH MINERALS) TABS Take 1 tablet by mouth daily.      .  penicillin v potassium (VEETID) 500 MG tablet Take 500 mg by mouth 4 (four) times daily. For 7 days.      . prochlorperazine (COMPAZINE) 10 MG tablet Take 1 tablet (10 mg total) by mouth every 6 (six) hours as needed.  60 tablet  0  . cephALEXin (KEFLEX) 500 MG capsule Take 1 capsule (500 mg total) by mouth 4 (four) times daily.  30 capsule  0  . mirtazapine (REMERON) 30 MG tablet Take 1 tablet (30 mg total) by mouth at bedtime.  30 tablet  2   No current facility-administered medications for this visit.    SURGICAL HISTORY:  Past Surgical History  Procedure Laterality Date  . Prostate surgery      Patient wife states they rimmed out prostate    REVIEW OF SYSTEMS:  A comprehensive review of systems was negative except for: Constitutional: positive for anorexia, fatigue and weight loss Respiratory: positive for dyspnea on exertion Musculoskeletal: positive for muscle weakness Behavioral/Psych: positive for depression Rash and inflammation at the middle of the back as well as the buttock area   PHYSICAL EXAMINATION: General appearance: alert, cooperative, fatigued and no distress Head: Normocephalic, without obvious abnormality, atraumatic Neck: no adenopathy Lymph nodes: Cervical, supraclavicular, and axillary nodes normal. Resp: clear to auscultation bilaterally Cardio: regular rate and rhythm, S1, S2 normal, no murmur, click, rub or gallop GI: soft, non-tender; bowel sounds normal; no masses,  no organomegaly Extremities: extremities normal, atraumatic, no cyanosis or edema Neurologic: Alert and oriented X 3, normal strength and tone. Normal symmetric reflexes. Normal coordination and gait  ECOG PERFORMANCE STATUS: 2 - Symptomatic, <50% confined to bed  Blood pressure 109/68, pulse 103, temperature 97.5 F (36.4 C), temperature source Oral, resp. rate 20, height 5\' 9"  (1.753 m), weight 173 lb 11.2 oz (78.79 kg).  LABORATORY DATA: Lab Results  Component Value Date   WBC 4.9  12/07/2012   HGB 13.2 12/07/2012   HCT 39.2 12/07/2012   MCV 94.2 12/07/2012   PLT 138* 12/07/2012      Chemistry      Component Value Date/Time   NA 128* 12/07/2012 1018   K 4.8 12/07/2012 1018   CL 97 12/07/2012 1018   CO2 21 12/07/2012 1018   BUN 35* 12/07/2012 1018   CREATININE 0.95 12/07/2012 1018      Component Value Date/Time   CALCIUM 8.3* 12/07/2012 1018   ALKPHOS 480* 12/05/2012 0428   AST 118* 12/05/2012 0428   ALT 79* 12/05/2012 0428   BILITOT 2.2* 12/05/2012 0428       RADIOGRAPHIC STUDIES: Mr Laqueta Jean Wo Contrast  Dec 26, 2012   *RADIOLOGY REPORT*  Clinical Data: Small cell lung cancer.  Staging.  Gait disturbance. Weakness of the legs and feet  MRI HEAD WITHOUT AND WITH CONTRAST  Technique:  Multiplanar, multiecho pulse sequences  of the brain and surrounding structures were obtained according to standard protocol without and with intravenous contrast  Contrast: 18mL MULTIHANCE GADOBENATE DIMEGLUMINE 529 MG/ML IV SOLN  Comparison: 04/02/2004  Findings: There is no evidence of metastatic disease to the brain or leptomeninges.  No evidence of primary mass lesion.  Diffusion imaging does not show any acute or subacute infarction. The brainstem shows mild chronic small vessel change in the pons. No focal cerebellar insult.  The cerebral hemispheres show moderate chronic small vessel ischemic change affecting the deep subcortical white matter.  No cortical or large vessel territory infarction. No hemorrhage, hydrocephalus or extra-axial collection.  No skull or skull base lesion.  No pituitary mass.  No inflammatory sinus disease.  IMPRESSION: No evidence of regional metastatic disease.  Chronic ischemic changes throughout the brain as outlined above.   Original Report Authenticated By: Paulina Fusi, M.D.   US Abdomen Complete  12/05/2012   *RADIOLOGY REPORT*  Clinical Data:  Elevated transaminase  ABDOMINAL ULTRASOUND COMPLETE  Comparison:  12/02/2012 CT  Findings:  Gallbladder:  Innumerable  gallstones are present.  The largest is 10 mm.  No wall thickening or Murphy's sign.  Common Bile Duct:  Within normal limits in caliber.  Liver: Innumerable heterogeneous liver masses are present stable compared with the recent CT of the abdomen.  IVC:  Appears normal.  Pancreas:  Obscured by overlying bowel gas.  Spleen:  Within normal limits in size and echotexture.  Right kidney:  Normal in size and parenchymal echogenicity.  No evidence of mass or hydronephrosis.  Left kidney:  Normal in size and parenchymal echogenicity.  No evidence of mass or hydronephrosis.  Abdominal Aorta:  Portions of the aorta were obscured.  Maximal visualized caliber is 1.2 cm.  IMPRESSION: Cholelithiasis.  Multiple liver masses as seen on the recent CT scan worrisome for metastatic disease.  Pancreas and portions of the aorta were obscured.   Original Report Authenticated By: Jolaine Click, M.D.    ASSESSMENT AND PLAN: This is a very pleasant 79 years old white male recently diagnosed with extensive stage small cell lung cancer currently undergoing systemic chemotherapy with reduced dose carboplatin and etoposide. He is status post 1 cycle. The patient tolerated the first cycle fairly well with no significant adverse effects except for mild fatigue and lack of appetite. He also has significant skin rash and inflammation at the lower back and buttock area. I will start the patient on treatment with Keflex 500 mg by mouth every 6 hours for the inflammation in the buttock area. I also advised the patient to apply antibiotic cream to this area. For lack of appetite and depression, I started the patient on Remeron 30 mg by mouth each bedtime. The patient would come back for followup visit in one week for reevaluation before starting the second cycle of his chemotherapy. He was advised to call immediately if he has any concerning symptoms in the interval.  The patient voices understanding of current disease status and treatment  options and is in agreement with the current care plan.  All questions were answered. The patient knows to call the clinic with any problems, questions or concerns. We can certainly see the patient much sooner if necessary.  I spent 15 minutes counseling the patient face to face. The total time spent in the appointment was 25 minutes.

## 2012-12-19 ENCOUNTER — Telehealth: Payer: Self-pay | Admitting: Internal Medicine

## 2012-12-19 ENCOUNTER — Telehealth: Payer: Self-pay | Admitting: *Deleted

## 2012-12-19 NOTE — Telephone Encounter (Signed)
talked to pt's wife and she is aware of appt and will get appt calendat on 12/25/12

## 2012-12-19 NOTE — Telephone Encounter (Signed)
CBC/ CMET results from 12/17/12 done at [facility] given to Dr Donnald Garre to review.  SLJ

## 2012-12-24 ENCOUNTER — Other Ambulatory Visit (HOSPITAL_BASED_OUTPATIENT_CLINIC_OR_DEPARTMENT_OTHER): Payer: Medicare Other | Admitting: Lab

## 2012-12-24 ENCOUNTER — Ambulatory Visit (HOSPITAL_BASED_OUTPATIENT_CLINIC_OR_DEPARTMENT_OTHER): Payer: Medicare Other

## 2012-12-24 ENCOUNTER — Telehealth: Payer: Self-pay | Admitting: Internal Medicine

## 2012-12-24 ENCOUNTER — Ambulatory Visit (HOSPITAL_BASED_OUTPATIENT_CLINIC_OR_DEPARTMENT_OTHER): Payer: Medicare Other | Admitting: Physician Assistant

## 2012-12-24 VITALS — BP 124/81 | HR 100 | Temp 97.9°F | Resp 20 | Ht 69.0 in | Wt 174.9 lb

## 2012-12-24 DIAGNOSIS — C787 Secondary malignant neoplasm of liver and intrahepatic bile duct: Secondary | ICD-10-CM

## 2012-12-24 DIAGNOSIS — C3490 Malignant neoplasm of unspecified part of unspecified bronchus or lung: Secondary | ICD-10-CM

## 2012-12-24 DIAGNOSIS — C3410 Malignant neoplasm of upper lobe, unspecified bronchus or lung: Secondary | ICD-10-CM

## 2012-12-24 DIAGNOSIS — F329 Major depressive disorder, single episode, unspecified: Secondary | ICD-10-CM

## 2012-12-24 DIAGNOSIS — Z5111 Encounter for antineoplastic chemotherapy: Secondary | ICD-10-CM

## 2012-12-24 DIAGNOSIS — R5381 Other malaise: Secondary | ICD-10-CM

## 2012-12-24 DIAGNOSIS — R21 Rash and other nonspecific skin eruption: Secondary | ICD-10-CM

## 2012-12-24 DIAGNOSIS — C7951 Secondary malignant neoplasm of bone: Secondary | ICD-10-CM

## 2012-12-24 DIAGNOSIS — R630 Anorexia: Secondary | ICD-10-CM

## 2012-12-24 DIAGNOSIS — C7952 Secondary malignant neoplasm of bone marrow: Secondary | ICD-10-CM

## 2012-12-24 DIAGNOSIS — R5383 Other fatigue: Secondary | ICD-10-CM

## 2012-12-24 DIAGNOSIS — R634 Abnormal weight loss: Secondary | ICD-10-CM

## 2012-12-24 LAB — COMPREHENSIVE METABOLIC PANEL (CC13)
Albumin: 2.2 g/dL — ABNORMAL LOW (ref 3.5–5.0)
BUN: 18.3 mg/dL (ref 7.0–26.0)
CO2: 25 mEq/L (ref 22–29)
Glucose: 321 mg/dl — ABNORMAL HIGH (ref 70–140)
Potassium: 4.4 mEq/L (ref 3.5–5.1)
Sodium: 137 mEq/L (ref 136–145)
Total Protein: 8.9 g/dL — ABNORMAL HIGH (ref 6.4–8.3)

## 2012-12-24 LAB — CBC WITH DIFFERENTIAL/PLATELET
Basophils Absolute: 0.1 10*3/uL (ref 0.0–0.1)
Eosinophils Absolute: 0 10*3/uL (ref 0.0–0.5)
HCT: 40.3 % (ref 38.4–49.9)
HGB: 13.3 g/dL (ref 13.0–17.1)
MCH: 31.7 pg (ref 27.2–33.4)
MCV: 96.2 fL (ref 79.3–98.0)
MONO%: 14.4 % — ABNORMAL HIGH (ref 0.0–14.0)
NEUT#: 9.9 10*3/uL — ABNORMAL HIGH (ref 1.5–6.5)
NEUT%: 71.5 % (ref 39.0–75.0)
RDW: 16.4 % — ABNORMAL HIGH (ref 11.0–14.6)
lymph#: 1.8 10*3/uL (ref 0.9–3.3)

## 2012-12-24 MED ORDER — DEXAMETHASONE SODIUM PHOSPHATE 20 MG/5ML IJ SOLN
20.0000 mg | Freq: Once | INTRAMUSCULAR | Status: AC
  Administered 2012-12-24: 20 mg via INTRAVENOUS

## 2012-12-24 MED ORDER — SODIUM CHLORIDE 0.9 % IV SOLN
Freq: Once | INTRAVENOUS | Status: AC
  Administered 2012-12-24: 13:00:00 via INTRAVENOUS

## 2012-12-24 MED ORDER — SODIUM CHLORIDE 0.9 % IV SOLN
400.0000 mg | Freq: Once | INTRAVENOUS | Status: AC
  Administered 2012-12-24: 400 mg via INTRAVENOUS
  Filled 2012-12-24: qty 40

## 2012-12-24 MED ORDER — ONDANSETRON 16 MG/50ML IVPB (CHCC)
16.0000 mg | Freq: Once | INTRAVENOUS | Status: AC
  Administered 2012-12-24: 16 mg via INTRAVENOUS

## 2012-12-24 MED ORDER — SODIUM CHLORIDE 0.9 % IV SOLN
100.0000 mg/m2 | Freq: Once | INTRAVENOUS | Status: AC
  Administered 2012-12-24: 200 mg via INTRAVENOUS
  Filled 2012-12-24: qty 10

## 2012-12-24 NOTE — Progress Notes (Addendum)
[facility] Telephone:([phone]) [phone]   Fax:([phone]) [phone]  SHARED VISIT PROGRESS NOTE  TAPPER,DAVID B, MD [address] [place] [address]  DIAGNOSIS AND STAGE: Extensive stage small cell lung cancer with bilateral pulmonary nodules, mediastinal and supraclavicular lymphadenopathy as well as liver and bone metastases diagnosed in July of 2014  PRIOR THERAPY: None  CURRENT THERAPY: Systemic chemotherapy with carboplatin for AUC of 4 on day 1 and etoposide 80 mg/M2 on days 1, 2 and 3 with Neulasta support on day 4. Status post one cycle. First cycle was given on 12/04/2012 at [facility].  CHEMOTHERAPY INTENT: Palliative  CURRENT # OF CHEMOTHERAPY CYCLES: 1  CURRENT ANTIEMETICS: Zofran, Compazine and dexamethasone  CURRENT SMOKING STATUS: Currently Nonsmoker, quit smoking 42 years ago.  ORAL CHEMOTHERAPY AND CONSENT: None  CURRENT BISPHOSPHONATES USE: None  PAIN MANAGEMENT: No pain  NARCOTICS INDUCED CONSTIPATION: No constipation  LIVING WILL AND CODE STATUS: Full code  INTERVAL HISTORY: Phillip [name] 79 y.o. male returns to the clinic today for followup visit accompanied by his wife and daughter. The patient started the first cycle of his systemic chemotherapy with reduced dose carboplatin and etoposide on 12/04/2012 during his hospitalization at [facility]. He tolerated the first cycle of his treatment fairly well with no significant adverse effects. The patient denied having any significant fever or chills. He denied having any nausea or vomiting. He has no chest pain but continues to have shortness breath with exertion, but overall feels as though he is breathing better. His rash is significantly better. He has about two or three more days of antibiotics to take. He also complains of lack of appetite but has managed to gain a little weight.He has repeat weekly CBC and comprehensive metabolic panel performed at [facility] in [facility]. He would like to get his Neulasta injection in [place] as well.  MEDICAL HISTORY: Past Medical History  Diagnosis Date  . Hypertension   . Pulmonary fibrosis 2010  . Sleep apnea   . Diabetes mellitus without complication   . GERD (gastroesophageal reflux disease)   . Arthritis   . Anxiety     ALLERGIES:  has No Known Allergies.  MEDICATIONS:  Current Outpatient Prescriptions  Medication Sig Dispense Refill  . albuterol (PROVENTIL) (5 MG/ML) 0.5% nebulizer solution Take 0.5 mLs (2.5 mg total) by nebulization every 6 (six) hours as needed for wheezing or shortness of breath.  20 mL  12  . allopurinol (ZYLOPRIM) 100 MG tablet Take 200 mg by mouth daily.      Marland Kitchen aspirin 81 MG tablet Take 81 mg by mouth daily.      . bumetanide (BUMEX) 1 MG tablet Take 1 mg by mouth daily.      . cephALEXin (KEFLEX) 500 MG capsule Take 1 capsule (500 mg total) by mouth 4 (four) times daily.  30 capsule  0  . chlorproMAZINE (THORAZINE) 25 MG tablet Take 25 mg by mouth 3 (three) times daily as needed (hiccups.).      Marland Kitchen colchicine 0.6 MG tablet Take 0.6 mg by mouth 2 (two) times daily as needed (gout).      . diazepam (VALIUM) 5 MG tablet Take 5 mg by mouth at bedtime as needed for sleep.      Marland Kitchen GLUCOSAMINE CHONDROITIN COMPLX PO Take 1 capsule by mouth 3 (three) times daily.      Marland Kitchen HYDROcodone-acetaminophen (NORCO) 10-325 MG per tablet Take 1 tablet  by mouth every 6 (six) hours as needed for pain.  120 tablet  0  . Insulin Isophane & Regular (NOVOLIN 70/30 RELION [place]) Inject 55 Units into the skin 2 (two) times daily.      Marland Kitchen lidocaine-prilocaine (EMLA) cream Apply topically as needed.  30 g  0  . LORazepam (ATIVAN) 1 MG tablet Take 1 tablet (1 mg total) by mouth every 8 (eight) hours as needed for anxiety.  90 tablet  0  . losartan (COZAAR) 100 MG tablet Take 100 mg by mouth daily.      . Magnesium-Zinc (MAGNESIUM-CHELATED ZINC PO) Take 1 tablet by mouth 3 (three) times daily.      .  methylPREDNISolone (MEDROL DOSEPAK) 4 MG tablet Take by mouth. follow package directions      . mirtazapine (REMERON) 30 MG tablet Take 1 tablet (30 mg total) by mouth at bedtime.  30 tablet  2  . Multiple Vitamin (MULTIVITAMIN WITH MINERALS) TABS Take 1 tablet by mouth daily.      . penicillin v potassium (VEETID) 500 MG tablet Take 500 mg by mouth 4 (four) times daily. For 7 days.      . prochlorperazine (COMPAZINE) 10 MG tablet Take 1 tablet (10 mg total) by mouth every 6 (six) hours as needed.  60 tablet  0   No current facility-administered medications for this visit.    SURGICAL HISTORY:  Past Surgical History  Procedure Laterality Date  . Prostate surgery      Patient wife states they rimmed out prostate    REVIEW OF SYSTEMS:  A comprehensive review of systems was negative except for: Constitutional: positive for anorexia, fatigue and weight loss Respiratory: positive for dyspnea on exertion Musculoskeletal: positive for muscle weakness Behavioral/Psych: positive for depression Rash and inflammation at the middle of the back as well as the buttock area   PHYSICAL EXAMINATION: General appearance: alert, cooperative, fatigued and no distress Head: Normocephalic, without obvious abnormality, atraumatic Neck: no adenopathy Lymph nodes: Cervical, supraclavicular, and axillary nodes normal. Resp: clear to auscultation bilaterally Cardio: regular rate and rhythm, S1, S2 normal, no murmur, click, rub or gallop GI: soft, non-tender; bowel sounds normal; no masses,  no organomegaly Extremities: extremities normal, atraumatic, no cyanosis or edema Neurologic: Alert and oriented X 3, normal strength and tone. Normal symmetric reflexes. Normal coordination and gait  ECOG PERFORMANCE STATUS: 2 - Symptomatic, <50% confined to bed  Blood pressure 124/81, pulse 100, temperature 97.9 F (36.6 C), temperature source Oral, resp. rate 20, height 5\' 9"  (1.753 m), weight 174 lb 14.4 oz (79.334  kg), SpO2 97.00%.  LABORATORY DATA: Lab Results  Component Value Date   WBC 13.9* 12/24/2012   HGB 13.3 12/24/2012   HCT 40.3 12/24/2012   MCV 96.2 12/24/2012   PLT 425* 12/24/2012      Chemistry      Component Value Date/Time   NA 137 12/24/2012 1151   NA 128* 12/07/2012 1018   K 4.4 12/24/2012 1151   K 4.8 12/07/2012 1018   CL 97 12/07/2012 1018   CO2 25 12/24/2012 1151   CO2 21 12/07/2012 1018   BUN 18.3 12/24/2012 1151   BUN 35* 12/07/2012 1018   CREATININE 1.4* 12/24/2012 1151   CREATININE 0.95 12/07/2012 1018      Component Value Date/Time   CALCIUM 9.0 12/24/2012 1151   CALCIUM 8.3* 12/07/2012 1018   ALKPHOS 751* 12/24/2012 1151   ALKPHOS 480* 12/05/2012 0428   AST 85* 12/24/2012 1151  AST 118* 12/05/2012 0428   ALT 43 12/24/2012 1151   ALT 79* 12/05/2012 0428   BILITOT 0.79 12/24/2012 1151   BILITOT 2.2* 12/05/2012 0428       RADIOGRAPHIC STUDIES: Mr Laqueta Jean ZO Contrast  12-27-2012   *RADIOLOGY REPORT*  Clinical Data: Small cell lung cancer.  Staging.  Gait disturbance. Weakness of the legs and feet  MRI HEAD WITHOUT AND WITH CONTRAST  Technique:  Multiplanar, multiecho pulse sequences of the brain and surrounding structures were obtained according to standard protocol without and with intravenous contrast  Contrast: 18mL MULTIHANCE GADOBENATE DIMEGLUMINE 529 MG/ML IV SOLN  Comparison: 04/02/2004  Findings: There is no evidence of metastatic disease to the brain or leptomeninges.  No evidence of primary mass lesion.  Diffusion imaging does not show any acute or subacute infarction. The brainstem shows mild chronic small vessel change in the pons. No focal cerebellar insult.  The cerebral hemispheres show moderate chronic small vessel ischemic change affecting the deep subcortical white matter.  No cortical or large vessel territory infarction. No hemorrhage, hydrocephalus or extra-axial collection.  No skull or skull base lesion.  No pituitary mass.  No inflammatory sinus disease.  IMPRESSION: No  evidence of regional metastatic disease.  Chronic ischemic changes throughout the brain as outlined above.   Original Report Authenticated By: Paulina Fusi, M.D.   US Abdomen Complete  12/05/2012   *RADIOLOGY REPORT*  Clinical Data:  Elevated transaminase  ABDOMINAL ULTRASOUND COMPLETE  Comparison:  12/02/2012 CT  Findings:  Gallbladder:  Innumerable gallstones are present.  The largest is 10 mm.  No wall thickening or Murphy's sign.  Common Bile Duct:  Within normal limits in caliber.  Liver: Innumerable heterogeneous liver masses are present stable compared with the recent CT of the abdomen.  IVC:  Appears normal.  Pancreas:  Obscured by overlying bowel gas.  Spleen:  Within normal limits in size and echotexture.  Right kidney:  Normal in size and parenchymal echogenicity.  No evidence of mass or hydronephrosis.  Left kidney:  Normal in size and parenchymal echogenicity.  No evidence of mass or hydronephrosis.  Abdominal Aorta:  Portions of the aorta were obscured.  Maximal visualized caliber is 1.2 cm.  IMPRESSION: Cholelithiasis.  Multiple liver masses as seen on the recent CT scan worrisome for metastatic disease.  Pancreas and portions of the aorta were obscured.   Original Report Authenticated By: Jolaine Click, M.D.    ASSESSMENT AND PLAN: This is a very pleasant 79 years old white male recently diagnosed with extensive stage small cell lung cancer currently undergoing systemic chemotherapy with reduced dose carboplatin and etoposide. He is status post 1 cycle. The patient tolerated the first cycle fairly well with no significant adverse effects except for mild fatigue and lack of appetite. Per the patient and family members his rash has significantly improved. The patient was advised to complete his course of antibiotics as prescribed. He will continue with weekly labs drawn in [place], [place] with the results faxed to Dr. Arbutus Ped. We will arrange for him to get his Neulasta injection in [place] if  possible on 12/27/2012.  He will follow up with Dr. Arbutus Ped in 3 weeks with a restaging CT scan of the chest, abdomen and pelvis with contrast to re-evaluate his disease. For lack of appetite and depression, he will continue Remeron 30 mg by mouth each bedtime.  Estelle Skibicki E, PA-C   He was advised to call immediately if he has any concerning symptoms in  the interval.  The patient voices understanding of current disease status and treatment options and is in agreement with the current care plan.  All questions were answered. The patient knows to call the clinic with any problems, questions or concerns. We can certainly see the patient much sooner if necessary.   ADDENDUM: Hematology/Oncology Attending: I have a face to face encounter with the patient today. I recommended his care plan.the patient is feeling fine today. He has significant improvement in his condition over the last week. He denied having any significant nausea or vomiting. He denied having any chest pain, shortness breath, cough or hemoptysis. He is here today to start cycle #2 of his chemotherapy with carboplatin and etoposide. He would be started on Remeron for depression and lack of appetite. The patient may be interested in receiving the remaining part of his treatment in [place] close to home. We will make the appropriate arrangements to transfer the patient his care to [facility] in [facility] if this is the final decision for the patient and his family. He is supposed to have repeat CT scan of the chest, abdomen and pelvis in 3 weeks after completion of this cycle.  Lajuana Matte., MD 12/24/2012

## 2012-12-24 NOTE — Patient Instructions (Addendum)
[facility] Discharge Instructions for Patients Receiving Chemotherapy  Today you received the following chemotherapy agents: carboplatin, etoposide  To help prevent nausea and vomiting after your treatment, we encourage you to take your nausea medication.  Take it as often as prescribed.     If you develop nausea and vomiting that is not controlled by your nausea medication, call the clinic. If it is after clinic hours your family physician or the after hours number for the clinic or go to the Emergency Department.   BELOW ARE SYMPTOMS THAT SHOULD BE REPORTED IMMEDIATELY:  *FEVER GREATER THAN 100.5 F  *CHILLS WITH OR WITHOUT FEVER  NAUSEA AND VOMITING THAT IS NOT CONTROLLED WITH YOUR NAUSEA MEDICATION  *UNUSUAL SHORTNESS OF BREATH  *UNUSUAL BRUISING OR BLEEDING  TENDERNESS IN MOUTH AND THROAT WITH OR WITHOUT PRESENCE OF ULCERS  *URINARY PROBLEMS  *BOWEL PROBLEMS  UNUSUAL RASH Items with * indicate a potential emergency and should be followed up as soon as possible.  Feel free to call the clinic you have any questions or concerns. The clinic phone number is [phone].   I have been informed and understand all the instructions given to me. I know to contact the clinic, my physician, or go to the Emergency Department if any problems should occur. I do not have any questions at this time, but understand that I may call the clinic during office hours   should I have any questions or need assistance in obtaining follow up care.    __________________________________________  _____________  __________ Signature of Patient or Authorized Representative            Date                   Time    __________________________________________ Nurse's Signature    Carboplatin injection What is this medicine? CARBOPLATIN (KAR boe pla tin) is a chemotherapy drug. It targets fast dividing cells, like cancer cells, and causes these cells to die. This medicine is used  to treat ovarian cancer and many other cancers. This medicine may be used for other purposes; ask your health care provider or pharmacist if you have questions. What should I tell my health care provider before I take this medicine? They need to know if you have any of these conditions: -blood disorders -hearing problems -kidney disease -recent or ongoing radiation therapy -an unusual or allergic reaction to carboplatin, cisplatin, other chemotherapy, other medicines, foods, dyes, or preservatives -pregnant or trying to get pregnant -breast-feeding How should I use this medicine? This drug is usually given as an infusion into a vein. It is administered in a hospital or clinic by a specially trained health care professional. Talk to your pediatrician regarding the use of this medicine in children. Special care may be needed. Overdosage: If you think you have taken too much of this medicine contact a poison control center or emergency room at once. NOTE: This medicine is only for you. Do not share this medicine with others. What if I miss a dose? It is important not to miss a dose. Call your doctor or health care professional if you are unable to keep an appointment. What may interact with this medicine? -medicines for seizures -medicines to increase blood counts like filgrastim, pegfilgrastim, sargramostim -some antibiotics like amikacin, gentamicin, neomycin, streptomycin, tobramycin -vaccines Talk to your doctor or health care professional before taking any of these medicines: -acetaminophen -aspirin -ibuprofen -ketoprofen -naproxen This list may not describe all possible interactions. Give  your health care provider a list of all the medicines, herbs, non-prescription drugs, or dietary supplements you use. Also tell them if you smoke, drink alcohol, or use illegal drugs. Some items may interact with your medicine. What should I watch for while using this medicine? Your condition will  be monitored carefully while you are receiving this medicine. You will need important blood work done while you are taking this medicine. This drug may make you feel generally unwell. This is not uncommon, as chemotherapy can affect healthy cells as well as cancer cells. Report any side effects. Continue your course of treatment even though you feel ill unless your doctor tells you to stop. In some cases, you may be given additional medicines to help with side effects. Follow all directions for their use. Call your doctor or health care professional for advice if you get a fever, chills or sore throat, or other symptoms of a cold or flu. Do not treat yourself. This drug decreases your body's ability to fight infections. Try to avoid being around people who are sick. This medicine may increase your risk to bruise or bleed. Call your doctor or health care professional if you notice any unusual bleeding. Be careful brushing and flossing your teeth or using a toothpick because you may get an infection or bleed more easily. If you have any dental work done, tell your dentist you are receiving this medicine. Avoid taking products that contain aspirin, acetaminophen, ibuprofen, naproxen, or ketoprofen unless instructed by your doctor. These medicines may hide a fever. Do not become pregnant while taking this medicine. Women should inform their doctor if they wish to become pregnant or think they might be pregnant. There is a potential for serious side effects to an unborn child. Talk to your health care professional or pharmacist for more information. Do not breast-feed an infant while taking this medicine. What side effects may I notice from receiving this medicine? Side effects that you should report to your doctor or health care professional as soon as possible: -allergic reactions like skin rash, itching or hives, swelling of the face, lips, or tongue -signs of infection - fever or chills, cough, sore  throat, pain or difficulty passing urine -signs of decreased platelets or bleeding - bruising, pinpoint red spots on the skin, black, tarry stools, nosebleeds -signs of decreased red blood cells - unusually weak or tired, fainting spells, lightheadedness -breathing problems -changes in hearing -changes in vision -chest pain -high blood pressure -low blood counts - This drug may decrease the number of white blood cells, red blood cells and platelets. You may be at increased risk for infections and bleeding. -nausea and vomiting -pain, swelling, redness or irritation at the injection site -pain, tingling, numbness in the hands or feet -problems with balance, talking, walking -trouble passing urine or change in the amount of urine Side effects that usually do not require medical attention (report to your doctor or health care professional if they continue or are bothersome): -hair loss -loss of appetite -metallic taste in the mouth or changes in taste This list may not describe all possible side effects. Call your doctor for medical advice about side effects. You may report side effects to FDA at 1-800-FDA-1088. Where should I keep my medicine? This drug is given in a hospital or clinic and will not be stored at home. NOTE: This sheet is a summary. It may not cover all possible information. If you have questions about this medicine, talk to your doctor, pharmacist,  or health care provider.  2012, Elsevier/Gold Standard. (08/14/2007 2:38:05 PM)  Etoposide, VP-16 injection What is this medicine? ETOPOSIDE, VP-16 (e toe POE side) is a chemotherapy drug. It is used to treat testicular cancer, lung cancer, and other cancers. This medicine may be used for other purposes; ask your health care provider or pharmacist if you have questions. What should I tell my health care provider before I take this medicine? They need to know if you have any of these conditions: -infection -kidney disease -low  blood counts, like low white cell, platelet, or red cell counts -an unusual or allergic reaction to etoposide, other chemotherapeutic agents, other medicines, foods, dyes, or preservatives -pregnant or trying to get pregnant -breast-feeding How should I use this medicine? This medicine is for infusion into a vein. It is administered in a hospital or clinic by a specially trained health care professional. Talk to your pediatrician regarding the use of this medicine in children. Special care may be needed. Overdosage: If you think you have taken too much of this medicine contact a poison control center or emergency room at once. NOTE: This medicine is only for you. Do not share this medicine with others. What if I miss a dose? It is important not to miss your dose. Call your doctor or health care professional if you are unable to keep an appointment. What may interact with this medicine? -cyclosporine -medicines to increase blood counts like filgrastim, pegfilgrastim, sargramostim -vaccines This list may not describe all possible interactions. Give your health care provider a list of all the medicines, herbs, non-prescription drugs, or dietary supplements you use. Also tell them if you smoke, drink alcohol, or use illegal drugs. Some items may interact with your medicine. What should I watch for while using this medicine? Visit your doctor for checks on your progress. This drug may make you feel generally unwell. This is not uncommon, as chemotherapy can affect healthy cells as well as cancer cells. Report any side effects. Continue your course of treatment even though you feel ill unless your doctor tells you to stop. In some cases, you may be given additional medicines to help with side effects. Follow all directions for their use. Call your doctor or health care professional for advice if you get a fever, chills or sore throat, or other symptoms of a cold or flu. Do not treat yourself. This drug  decreases your body's ability to fight infections. Try to avoid being around people who are sick. This medicine may increase your risk to bruise or bleed. Call your doctor or health care professional if you notice any unusual bleeding. Be careful brushing and flossing your teeth or using a toothpick because you may get an infection or bleed more easily. If you have any dental work done, tell your dentist you are receiving this medicine. Avoid taking products that contain aspirin, acetaminophen, ibuprofen, naproxen, or ketoprofen unless instructed by your doctor. These medicines may hide a fever. Do not become pregnant while taking this medicine. Women should inform their doctor if they wish to become pregnant or think they might be pregnant. There is a potential for serious side effects to an unborn child. Talk to your health care professional or pharmacist for more information. Do not breast-feed an infant while taking this medicine. What side effects may I notice from receiving this medicine? Side effects that you should report to your doctor or health care professional as soon as possible: -allergic reactions like skin rash, itching or hives,  swelling of the face, lips, or tongue -low blood counts - this medicine may decrease the number of white blood cells, red blood cells and platelets. You may be at increased risk for infections and bleeding. -signs of infection - fever or chills, cough, sore throat, pain or difficulty passing urine -signs of decreased platelets or bleeding - bruising, pinpoint red spots on the skin, black, tarry stools, blood in the urine -signs of decreased red blood cells - unusually weak or tired, fainting spells, lightheadedness -breathing problems -changes in vision -mouth or throat sores or ulcers -pain, redness, swelling or irritation at the injection site -pain, tingling, numbness in the hands or feet -redness, blistering, peeling or loosening of the skin, including  inside the mouth -seizures -vomiting Side effects that usually do not require medical attention (report to your doctor or health care professional if they continue or are bothersome): -diarrhea -hair loss -loss of appetite -nausea -stomach pain This list may not describe all possible side effects. Call your doctor for medical advice about side effects. You may report side effects to FDA at 1-800-FDA-1088. Where should I keep my medicine? This drug is given in a hospital or clinic and will not be stored at home. NOTE: This sheet is a summary. It may not cover all possible information. If you have questions about this medicine, talk to your doctor, pharmacist, or health care provider.  2012, Elsevier/Gold Standard. (09/10/2007 5:24:12 PM)

## 2012-12-24 NOTE — Telephone Encounter (Signed)
, °

## 2012-12-25 ENCOUNTER — Other Ambulatory Visit: Payer: Self-pay | Admitting: *Deleted

## 2012-12-25 ENCOUNTER — Other Ambulatory Visit: Payer: Self-pay | Admitting: Internal Medicine

## 2012-12-25 ENCOUNTER — Ambulatory Visit (HOSPITAL_BASED_OUTPATIENT_CLINIC_OR_DEPARTMENT_OTHER): Payer: Medicare Other

## 2012-12-25 VITALS — BP 155/74 | HR 83 | Temp 98.1°F | Resp 20

## 2012-12-25 DIAGNOSIS — C3410 Malignant neoplasm of upper lobe, unspecified bronchus or lung: Secondary | ICD-10-CM

## 2012-12-25 DIAGNOSIS — Z5111 Encounter for antineoplastic chemotherapy: Secondary | ICD-10-CM

## 2012-12-25 DIAGNOSIS — C7951 Secondary malignant neoplasm of bone: Secondary | ICD-10-CM

## 2012-12-25 DIAGNOSIS — C3490 Malignant neoplasm of unspecified part of unspecified bronchus or lung: Secondary | ICD-10-CM

## 2012-12-25 DIAGNOSIS — C787 Secondary malignant neoplasm of liver and intrahepatic bile duct: Secondary | ICD-10-CM

## 2012-12-25 DIAGNOSIS — C7952 Secondary malignant neoplasm of bone marrow: Secondary | ICD-10-CM

## 2012-12-25 MED ORDER — SODIUM CHLORIDE 0.9 % IV SOLN
100.0000 mg/m2 | Freq: Once | INTRAVENOUS | Status: AC
  Administered 2012-12-25: 200 mg via INTRAVENOUS
  Filled 2012-12-25: qty 10

## 2012-12-25 MED ORDER — PROCHLORPERAZINE MALEATE 10 MG PO TABS
10.0000 mg | ORAL_TABLET | Freq: Once | ORAL | Status: AC
  Administered 2012-12-25: 10 mg via ORAL

## 2012-12-25 MED ORDER — SODIUM CHLORIDE 0.9 % IV SOLN
Freq: Once | INTRAVENOUS | Status: AC
  Administered 2012-12-25: 14:00:00 via INTRAVENOUS

## 2012-12-25 NOTE — Patient Instructions (Addendum)
Continue weekly labs as scheduled Followup with Dr. Mohamed in 3 weeks with a restaging CT scan of your chest, abdomen and pelvis to reevaluate your disease. 

## 2012-12-25 NOTE — Patient Instructions (Addendum)
[facility] Discharge Instructions for Patients Receiving Chemotherapy  Today you received the following chemotherapy agents [id number]  To help prevent nausea and vomiting after your treatment, we encourage you to take your nausea medication     If you develop nausea and vomiting that is not controlled by your nausea medication, call the clinic.   BELOW ARE SYMPTOMS THAT SHOULD BE REPORTED IMMEDIATELY:  *FEVER GREATER THAN 100.5 F  *CHILLS WITH OR WITHOUT FEVER  NAUSEA AND VOMITING THAT IS NOT CONTROLLED WITH YOUR NAUSEA MEDICATION  *UNUSUAL SHORTNESS OF BREATH  *UNUSUAL BRUISING OR BLEEDING  TENDERNESS IN MOUTH AND THROAT WITH OR WITHOUT PRESENCE OF ULCERS  *URINARY PROBLEMS  *BOWEL PROBLEMS  UNUSUAL RASH Items with * indicate a potential emergency and should be followed up as soon as possible.  Feel free to call the clinic you have any questions or concerns. The clinic phone number is [phone].    

## 2012-12-26 ENCOUNTER — Ambulatory Visit (HOSPITAL_BASED_OUTPATIENT_CLINIC_OR_DEPARTMENT_OTHER): Payer: Medicare Other

## 2012-12-26 ENCOUNTER — Telehealth: Payer: Self-pay | Admitting: Medical Oncology

## 2012-12-26 VITALS — BP 144/88 | HR 67 | Resp 22

## 2012-12-26 DIAGNOSIS — C3410 Malignant neoplasm of upper lobe, unspecified bronchus or lung: Secondary | ICD-10-CM

## 2012-12-26 DIAGNOSIS — Z5111 Encounter for antineoplastic chemotherapy: Secondary | ICD-10-CM

## 2012-12-26 DIAGNOSIS — C3490 Malignant neoplasm of unspecified part of unspecified bronchus or lung: Secondary | ICD-10-CM

## 2012-12-26 MED ORDER — SODIUM CHLORIDE 0.9 % IV SOLN
Freq: Once | INTRAVENOUS | Status: AC
  Administered 2012-12-26: 15:00:00 via INTRAVENOUS

## 2012-12-26 MED ORDER — PROCHLORPERAZINE MALEATE 10 MG PO TABS
10.0000 mg | ORAL_TABLET | Freq: Once | ORAL | Status: AC
  Administered 2012-12-26: 10 mg via ORAL

## 2012-12-26 MED ORDER — SODIUM CHLORIDE 0.9 % IV SOLN
100.0000 mg/m2 | Freq: Once | INTRAVENOUS | Status: AC
  Administered 2012-12-26: 200 mg via INTRAVENOUS
  Filled 2012-12-26: qty 10

## 2012-12-26 NOTE — Progress Notes (Signed)
Pt and wife aware of injection appt at [facility] tomorrow at 1330. Verified for wife that the RN is working on transferring his care to [facility] at [employer] request.

## 2012-12-26 NOTE — Patient Instructions (Addendum)
[facility] Discharge Instructions for Patients Receiving Chemotherapy  Today you received the following chemotherapy agents ETOPOSIDE  To help prevent nausea and vomiting after your treatment, we encourage you to take your nausea medication IF NEEDED MAY TAKE ABOUT 9 PM.   If you develop nausea and vomiting that is not controlled by your nausea medication, call the clinic.   BELOW ARE SYMPTOMS THAT SHOULD BE REPORTED IMMEDIATELY:  *FEVER GREATER THAN 100.5 F  *CHILLS WITH OR WITHOUT FEVER  NAUSEA AND VOMITING THAT IS NOT CONTROLLED WITH YOUR NAUSEA MEDICATION  *UNUSUAL SHORTNESS OF BREATH  *UNUSUAL BRUISING OR BLEEDING  TENDERNESS IN MOUTH AND THROAT WITH OR WITHOUT PRESENCE OF ULCERS  *URINARY PROBLEMS  *BOWEL PROBLEMS  UNUSUAL RASH Items with * indicate a potential emergency and should be followed up as soon as possible.  Feel free to call the clinic you have any questions or concerns. The clinic phone number is [phone].

## 2012-12-26 NOTE — Telephone Encounter (Signed)
Pt and wife given  appt time for tomorrow at [facility] where he will get Neulasta.

## 2012-12-26 NOTE — Telephone Encounter (Signed)
I called and referred pt to [facility] for further treatment. I will send Med records , tiffany an email to send records

## 2012-12-27 ENCOUNTER — Telehealth: Payer: Self-pay | Admitting: Internal Medicine

## 2012-12-27 ENCOUNTER — Other Ambulatory Visit: Payer: Self-pay | Admitting: Medical Oncology

## 2012-12-27 ENCOUNTER — Ambulatory Visit: Payer: Medicare Other

## 2012-12-27 DIAGNOSIS — Z5189 Encounter for other specified aftercare: Secondary | ICD-10-CM

## 2012-12-27 DIAGNOSIS — C3490 Malignant neoplasm of unspecified part of unspecified bronchus or lung: Secondary | ICD-10-CM

## 2012-12-27 NOTE — Telephone Encounter (Signed)
Faxed pt medical records to [facility]

## 2012-12-27 NOTE — Telephone Encounter (Signed)
gv referral to [facility].

## 2012-12-28 ENCOUNTER — Telehealth: Payer: Self-pay | Admitting: Internal Medicine

## 2012-12-28 NOTE — Telephone Encounter (Signed)
Pt appt. At the [facility] is 01/09/13@9 :00. Medical records faxed. Pt is aware

## 2012-12-31 ENCOUNTER — Other Ambulatory Visit: Payer: Medicare Other | Admitting: Lab

## 2013-01-03 ENCOUNTER — Telehealth: Payer: Self-pay | Admitting: *Deleted

## 2013-01-03 ENCOUNTER — Other Ambulatory Visit: Payer: Self-pay | Admitting: Radiology

## 2013-01-03 NOTE — Telephone Encounter (Signed)
Pt's wife called stating they would prefer to stay at [facility] for treatment.  He would still need lab work and injections done at [facility].  Informed Dr Donnald Garre.  SLJ

## 2013-01-04 ENCOUNTER — Telehealth: Payer: Self-pay | Admitting: *Deleted

## 2013-01-04 NOTE — Telephone Encounter (Signed)
Lab work drawn at [facility] 12/31/2012 CBC / CMET given to Dr Donnald Garre to review.

## 2013-01-07 ENCOUNTER — Encounter (HOSPITAL_COMMUNITY): Payer: Self-pay | Admitting: Pharmacy Technician

## 2013-01-09 ENCOUNTER — Other Ambulatory Visit: Payer: Self-pay | Admitting: Internal Medicine

## 2013-01-09 ENCOUNTER — Ambulatory Visit (HOSPITAL_COMMUNITY)
Admission: RE | Admit: 2013-01-09 | Discharge: 2013-01-09 | Disposition: A | Discharge status: Home / Self Care | Payer: Medicare Other | Source: Ambulatory Visit | Attending: Internal Medicine | Admitting: Internal Medicine

## 2013-01-09 ENCOUNTER — Encounter (HOSPITAL_COMMUNITY): Payer: Self-pay

## 2013-01-09 DIAGNOSIS — E119 Type 2 diabetes mellitus without complications: Secondary | ICD-10-CM | POA: Insufficient documentation

## 2013-01-09 DIAGNOSIS — Z794 Long term (current) use of insulin: Secondary | ICD-10-CM | POA: Insufficient documentation

## 2013-01-09 DIAGNOSIS — Z79899 Other long term (current) drug therapy: Secondary | ICD-10-CM | POA: Insufficient documentation

## 2013-01-09 DIAGNOSIS — G4730 Sleep apnea, unspecified: Secondary | ICD-10-CM | POA: Insufficient documentation

## 2013-01-09 DIAGNOSIS — C3490 Malignant neoplasm of unspecified part of unspecified bronchus or lung: Secondary | ICD-10-CM | POA: Insufficient documentation

## 2013-01-09 DIAGNOSIS — I10 Essential (primary) hypertension: Secondary | ICD-10-CM | POA: Insufficient documentation

## 2013-01-09 DIAGNOSIS — K219 Gastro-esophageal reflux disease without esophagitis: Secondary | ICD-10-CM | POA: Insufficient documentation

## 2013-01-09 LAB — PROTIME-INR
INR: 1.09 (ref 0.00–1.49)
Prothrombin Time: 13.9 seconds (ref 11.6–15.2)

## 2013-01-09 LAB — GLUCOSE, CAPILLARY: Glucose-Capillary: 93 mg/dL (ref 70–99)

## 2013-01-09 LAB — CBC
Platelets: 66 10*3/uL — ABNORMAL LOW (ref 150–400)
RDW: 16.1 % — ABNORMAL HIGH (ref 11.5–15.5)
WBC: 27.9 10*3/uL — ABNORMAL HIGH (ref 4.0–10.5)

## 2013-01-09 MED ORDER — MIDAZOLAM HCL 2 MG/2ML IJ SOLN
INTRAMUSCULAR | Status: AC
  Filled 2013-01-09: qty 4

## 2013-01-09 MED ORDER — HEPARIN SOD (PORK) LOCK FLUSH 100 UNIT/ML IV SOLN
INTRAVENOUS | Status: AC | PRN
  Administered 2013-01-09: 500 [IU]

## 2013-01-09 MED ORDER — FENTANYL CITRATE 0.05 MG/ML IJ SOLN
INTRAMUSCULAR | Status: AC | PRN
  Administered 2013-01-09 (×3): 50 ug via INTRAVENOUS

## 2013-01-09 MED ORDER — FENTANYL CITRATE 0.05 MG/ML IJ SOLN
INTRAMUSCULAR | Status: AC
  Filled 2013-01-09: qty 4

## 2013-01-09 MED ORDER — MIDAZOLAM HCL 2 MG/2ML IJ SOLN
INTRAMUSCULAR | Status: AC | PRN
  Administered 2013-01-09 (×3): 1 mg via INTRAVENOUS

## 2013-01-09 MED ORDER — CEFAZOLIN SODIUM-DEXTROSE 2-3 GM-% IV SOLR
2.0000 g | Freq: Once | INTRAVENOUS | Status: AC
  Administered 2013-01-09: 2 g via INTRAVENOUS
  Filled 2013-01-09: qty 50

## 2013-01-09 MED ORDER — SODIUM CHLORIDE 0.9 % IV SOLN
Freq: Once | INTRAVENOUS | Status: AC
  Administered 2013-01-09: 13:00:00 via INTRAVENOUS

## 2013-01-09 MED ORDER — HEPARIN SOD (PORK) LOCK FLUSH 100 UNIT/ML IV SOLN: 500.0000 [IU] | Freq: Once | INTRAVENOUS | Status: DC

## 2013-01-09 NOTE — H&P (Signed)
Agree with PA note.  Signed,  Basilio Meadow K. Rorey Bisson, MD [facility]  

## 2013-01-09 NOTE — Procedures (Signed)
Interventional Radiology Procedure Note  Procedure: Placement of a right subclavian approach single lumen PowerPort.  Tip is positioned at the superior cavoatrial junction and catheter is ready for immediate use.  Complications: No immediate Recommendations:  - Ok to shower tomorrow - Do not submerge for 7 days - Routine line care   Signed,  Sterling Big, MD V[facility] Interventional Radiologist [facility]

## 2013-01-09 NOTE — H&P (Signed)
Phillip [name] is an 79 y.o. male.   Chief Complaint: "I'm getting a port put in" HPI: Patient with history of metastatic small cell lung carcinoma presents today for port a cath placement for chemotherapy.  Past Medical History  Diagnosis Date  . Hypertension   . Pulmonary fibrosis 2010  . Sleep apnea   . Diabetes mellitus without complication   . GERD (gastroesophageal reflux disease)   . Arthritis   . Anxiety     Past Surgical History  Procedure Laterality Date  . Prostate surgery      Patient wife states they rimmed out prostate    History reviewed. No pertinent family history. Social History:  reports that he quit smoking about 39 years ago. His smoking use included Cigarettes and Pipe. He has a 20 pack-year smoking history. He has never used smokeless tobacco. He reports that he does not drink alcohol. His drug history is not on file.  Allergies: No Known Allergies  Current outpatient prescriptions:allopurinol (ZYLOPRIM) 100 MG tablet, Take 200 mg by mouth daily., Disp: , Rfl: ;  aspirin 81 MG tablet, Take 81 mg by mouth daily., Disp: , Rfl: ;  bumetanide (BUMEX) 1 MG tablet, Take 1 mg by mouth daily., Disp: , Rfl: ;  colchicine 0.6 MG tablet, Take 0.6 mg by mouth 2 (two) times daily as needed (gout)., Disp: , Rfl:  diazepam (VALIUM) 5 MG tablet, Take 5 mg by mouth at bedtime as needed for sleep., Disp: , Rfl: ;  GLUCOSAMINE CHONDROITIN COMPLX PO, Take 1 capsule by mouth 3 (three) times daily., Disp: , Rfl: ;  HYDROcodone-acetaminophen (NORCO) 10-325 MG per tablet, Take 1 tablet by mouth every 6 (six) hours as needed for pain., Disp: 120 tablet, Rfl: 0 Insulin Isophane & Regular (NOVOLIN 70/30 RELION Villalba), Inject 55 Units into the skin 2 (two) times daily., Disp: , Rfl: ;  LORazepam (ATIVAN) 1 MG tablet, Take 1 tablet (1 mg total) by mouth every 8 (eight) hours as needed for anxiety., Disp: 90 tablet, Rfl: 0;  losartan (COZAAR) 100 MG tablet, Take 100 mg by mouth daily., Disp: , Rfl:  ;  Magnesium-Zinc (MAGNESIUM-CHELATED ZINC PO), Take 1 tablet by mouth 3 (three) times daily., Disp: , Rfl:  Multiple Vitamin (MULTIVITAMIN WITH MINERALS) TABS, Take 1 tablet by mouth daily., Disp: , Rfl: ;  penicillin v potassium (VEETID) 500 MG tablet, Take 500 mg by mouth 4 (four) times daily. Take for 7 days, Disp: , Rfl: ;  prochlorperazine (COMPAZINE) 10 MG tablet, Take 1 tablet (10 mg total) by mouth every 6 (six) hours as needed., Disp: 60 tablet, Rfl: 0 albuterol (PROVENTIL) (5 MG/ML) 0.5% nebulizer solution, Take 0.5 mLs (2.5 mg total) by nebulization every 6 (six) hours as needed for wheezing or shortness of breath., Disp: 20 mL, Rfl: 12;  chlorproMAZINE (THORAZINE) 25 MG tablet, Take 25 mg by mouth 3 (three) times daily as needed (hiccups.)., Disp: , Rfl: ;  lidocaine-prilocaine (EMLA) cream, Apply topically as needed., Disp: 30 g, Rfl: 0 methylPREDNISolone (MEDROL DOSEPAK) 4 MG tablet, Take by mouth. follow package directions, Disp: , Rfl: ;  mirtazapine (REMERON) 30 MG tablet, Take 1 tablet (30 mg total) by mouth at bedtime., Disp: 30 tablet, Rfl: 2;  penicillin v potassium (VEETID) 500 MG tablet, Take 500 mg by mouth 4 (four) times daily. For 7 days., Disp: , Rfl:  Current facility-administered medications:ceFAZolin (ANCEF) IVPB 2 g/50 mL premix, 2 g, Intravenous, Once, Berneta Levins, PA-C   Results for orders  placed during the hospital encounter of 01/09/13 (from the past 48 hour(s))  CBC     Status: Abnormal   Collection Time    01/09/13  1:00 PM      Result Value Range   WBC 27.9 (*) 4.0 - 10.5 K/uL   RBC 3.60 (*) 4.22 - 5.81 MIL/uL   Hemoglobin 11.3 (*) 13.0 - 17.0 g/dL   HCT 86.5 (*) 78.4 - 69.6 %   MCV 96.9  78.0 - 100.0 fL   MCH 31.4  26.0 - 34.0 pg   MCHC 32.4  30.0 - 36.0 g/dL   RDW 29.5 (*) 28.4 - 13.2 %   Platelets 66 (*) 150 - 400 K/uL   Comment: SPECIMEN CHECKED FOR CLOTS     REPEATED TO VERIFY     LARGE PLATELETS PRESENT  PROTIME-INR     Status: None    Collection Time    01/09/13  1:00 PM      Result Value Range   Prothrombin Time 13.9  11.6 - 15.2 seconds   INR 1.09  0.00 - 1.49   No results found.  Review of Systems  Constitutional: Negative for fever and chills.  Respiratory: Negative for shortness of breath.        Occ non prod cough  Cardiovascular: Negative for chest pain.  Gastrointestinal: Negative for nausea, vomiting and abdominal pain.  Musculoskeletal: Negative for back pain.  Neurological: Negative for headaches.    Blood pressure 103/86, pulse 105, temperature 97 F (36.1 C), temperature source Oral, resp. rate 22, SpO2 97.00%. Physical Exam  Constitutional: He is oriented to person, place, and time. He appears well-developed and well-nourished.  Cardiovascular: Normal rate and regular rhythm.   Sl tachy but regular rhythm  Respiratory: Effort normal.  Sl dim BS bases  GI: Soft. Bowel sounds are normal. There is no tenderness.  Musculoskeletal: Normal range of motion. He exhibits no edema.  Neurological: He is alert and oriented to person, place, and time.     Assessment/Plan Pt with hx of metastatic small cell lung carcinoma. Plan is for port a cath placement today for chemotherapy. Details/risks of procedure d/w pt/wife with their understanding and consent.  Keondria Siever,D KEVIN 01/09/2013, 2:43 PM

## 2013-01-11 ENCOUNTER — Ambulatory Visit (HOSPITAL_COMMUNITY)
Admission: RE | Admit: 2013-01-11 | Discharge: 2013-01-11 | Disposition: A | Discharge status: Home / Self Care | Payer: Medicare Other | Source: Ambulatory Visit | Attending: Physician Assistant | Admitting: Physician Assistant

## 2013-01-11 ENCOUNTER — Encounter (HOSPITAL_COMMUNITY): Payer: Self-pay

## 2013-01-11 DIAGNOSIS — C3490 Malignant neoplasm of unspecified part of unspecified bronchus or lung: Secondary | ICD-10-CM | POA: Insufficient documentation

## 2013-01-11 DIAGNOSIS — R599 Enlarged lymph nodes, unspecified: Secondary | ICD-10-CM | POA: Insufficient documentation

## 2013-01-11 DIAGNOSIS — K5730 Diverticulosis of large intestine without perforation or abscess without bleeding: Secondary | ICD-10-CM | POA: Insufficient documentation

## 2013-01-11 DIAGNOSIS — I2510 Atherosclerotic heart disease of native coronary artery without angina pectoris: Secondary | ICD-10-CM | POA: Insufficient documentation

## 2013-01-11 DIAGNOSIS — R918 Other nonspecific abnormal finding of lung field: Secondary | ICD-10-CM | POA: Insufficient documentation

## 2013-01-11 DIAGNOSIS — J438 Other emphysema: Secondary | ICD-10-CM | POA: Insufficient documentation

## 2013-01-11 DIAGNOSIS — I700 Atherosclerosis of aorta: Secondary | ICD-10-CM | POA: Insufficient documentation

## 2013-01-11 DIAGNOSIS — K8020 Calculus of gallbladder without cholecystitis without obstruction: Secondary | ICD-10-CM | POA: Insufficient documentation

## 2013-01-11 DIAGNOSIS — N289 Disorder of kidney and ureter, unspecified: Secondary | ICD-10-CM | POA: Insufficient documentation

## 2013-01-11 DIAGNOSIS — K7689 Other specified diseases of liver: Secondary | ICD-10-CM | POA: Insufficient documentation

## 2013-01-11 HISTORY — DX: Malignant (primary) neoplasm, unspecified: C80.1

## 2013-01-11 MED ORDER — IOHEXOL 300 MG/ML  SOLN
80.0000 mL | Freq: Once | INTRAMUSCULAR | Status: AC | PRN
  Administered 2013-01-11: 80 mL via INTRAVENOUS

## 2013-01-14 ENCOUNTER — Telehealth: Payer: Self-pay | Admitting: *Deleted

## 2013-01-14 ENCOUNTER — Telehealth: Payer: Self-pay | Admitting: Oncology

## 2013-01-14 ENCOUNTER — Ambulatory Visit (HOSPITAL_BASED_OUTPATIENT_CLINIC_OR_DEPARTMENT_OTHER): Payer: Medicare Other

## 2013-01-14 ENCOUNTER — Other Ambulatory Visit (HOSPITAL_BASED_OUTPATIENT_CLINIC_OR_DEPARTMENT_OTHER): Payer: Medicare Other

## 2013-01-14 ENCOUNTER — Ambulatory Visit: Payer: Medicare Other | Admitting: Internal Medicine

## 2013-01-14 ENCOUNTER — Encounter: Payer: Self-pay | Admitting: Internal Medicine

## 2013-01-14 ENCOUNTER — Other Ambulatory Visit: Payer: Medicare Other | Admitting: Lab

## 2013-01-14 ENCOUNTER — Ambulatory Visit (HOSPITAL_BASED_OUTPATIENT_CLINIC_OR_DEPARTMENT_OTHER): Payer: Medicare Other | Admitting: Internal Medicine

## 2013-01-14 VITALS — BP 134/79 | HR 70 | Temp 98.6°F

## 2013-01-14 VITALS — BP 102/68 | HR 106 | Temp 96.9°F | Resp 20 | Ht 69.0 in | Wt 174.9 lb

## 2013-01-14 DIAGNOSIS — C3410 Malignant neoplasm of upper lobe, unspecified bronchus or lung: Secondary | ICD-10-CM

## 2013-01-14 DIAGNOSIS — C787 Secondary malignant neoplasm of liver and intrahepatic bile duct: Secondary | ICD-10-CM

## 2013-01-14 DIAGNOSIS — C3491 Malignant neoplasm of unspecified part of right bronchus or lung: Secondary | ICD-10-CM

## 2013-01-14 DIAGNOSIS — C7951 Secondary malignant neoplasm of bone: Secondary | ICD-10-CM

## 2013-01-14 DIAGNOSIS — Z5111 Encounter for antineoplastic chemotherapy: Secondary | ICD-10-CM

## 2013-01-14 DIAGNOSIS — C3490 Malignant neoplasm of unspecified part of unspecified bronchus or lung: Secondary | ICD-10-CM

## 2013-01-14 LAB — CBC WITH DIFFERENTIAL/PLATELET
BASO%: 0.5 % (ref 0.0–2.0)
EOS%: 0.1 % (ref 0.0–7.0)
HCT: 33.7 % — ABNORMAL LOW (ref 38.4–49.9)
MCH: 32.3 pg (ref 27.2–33.4)
MCHC: 32.9 g/dL (ref 32.0–36.0)
NEUT%: 67.4 % (ref 39.0–75.0)
RDW: 19.1 % — ABNORMAL HIGH (ref 11.0–14.6)
lymph#: 2.5 10*3/uL (ref 0.9–3.3)

## 2013-01-14 LAB — COMPREHENSIVE METABOLIC PANEL (CC13)
AST: 122 U/L — ABNORMAL HIGH (ref 5–34)
Albumin: 2.4 g/dL — ABNORMAL LOW (ref 3.5–5.0)
Alkaline Phosphatase: 777 U/L — ABNORMAL HIGH (ref 40–150)
BUN: 14 mg/dL (ref 7.0–26.0)
Potassium: 3.5 mEq/L (ref 3.5–5.1)

## 2013-01-14 MED ORDER — SODIUM CHLORIDE 0.9 % IJ SOLN
10.0000 mL | INTRAMUSCULAR | Status: DC | PRN
  Administered 2013-01-14: 10 mL
  Filled 2013-01-14: qty 10

## 2013-01-14 MED ORDER — SODIUM CHLORIDE 0.9 % IV SOLN
80.0000 mg/m2 | Freq: Once | INTRAVENOUS | Status: AC
  Administered 2013-01-14: 160 mg via INTRAVENOUS
  Filled 2013-01-14: qty 8

## 2013-01-14 MED ORDER — ONDANSETRON 16 MG/50ML IVPB (CHCC)
16.0000 mg | Freq: Once | INTRAVENOUS | Status: AC
  Administered 2013-01-14: 16 mg via INTRAVENOUS

## 2013-01-14 MED ORDER — HEPARIN SOD (PORK) LOCK FLUSH 100 UNIT/ML IV SOLN
500.0000 [IU] | Freq: Once | INTRAVENOUS | Status: AC | PRN
  Administered 2013-01-14: 500 [IU]
  Filled 2013-01-14: qty 5

## 2013-01-14 MED ORDER — DEXAMETHASONE SODIUM PHOSPHATE 20 MG/5ML IJ SOLN
20.0000 mg | Freq: Once | INTRAMUSCULAR | Status: AC
  Administered 2013-01-14: 20 mg via INTRAVENOUS

## 2013-01-14 MED ORDER — SODIUM CHLORIDE 0.9 % IV SOLN
300.0000 mg | Freq: Once | INTRAVENOUS | Status: AC
  Administered 2013-01-14: 300 mg via INTRAVENOUS
  Filled 2013-01-14: qty 30

## 2013-01-14 MED ORDER — SODIUM CHLORIDE 0.9 % IV SOLN
Freq: Once | INTRAVENOUS | Status: AC
  Administered 2013-01-14: 14:00:00 via INTRAVENOUS

## 2013-01-14 NOTE — Telephone Encounter (Signed)
Gave pt appt for lab , chemo and ML for September, called [facility], they want a faxed order for labs and neulasta injections, called nurse and left message to fax order

## 2013-01-14 NOTE — Telephone Encounter (Signed)
Per staff message and POF I have scheduled appts.  JMW  

## 2013-01-14 NOTE — Patient Instructions (Signed)
Scan showed improvement in your disease Continue chemotherapy today as scheduled

## 2013-01-14 NOTE — Patient Instructions (Addendum)
[facility] Discharge Instructions for Patients Receiving Chemotherapy  Today you received the following chemotherapy agents CARBOPLATIN, ETOPOSIDE  To help prevent nausea and vomiting after your treatment, we encourage you to take your nausea medication IF NEEDED   If you develop nausea and vomiting that is not controlled by your nausea medication, call the clinic.   BELOW ARE SYMPTOMS THAT SHOULD BE REPORTED IMMEDIATELY:  *FEVER GREATER THAN 100.5 F  *CHILLS WITH OR WITHOUT FEVER  NAUSEA AND VOMITING THAT IS NOT CONTROLLED WITH YOUR NAUSEA MEDICATION  *UNUSUAL SHORTNESS OF BREATH  *UNUSUAL BRUISING OR BLEEDING  TENDERNESS IN MOUTH AND THROAT WITH OR WITHOUT PRESENCE OF ULCERS  *URINARY PROBLEMS  *BOWEL PROBLEMS  UNUSUAL RASH Items with * indicate a potential emergency and should be followed up as soon as possible.  Feel free to call the clinic you have any questions or concerns. The clinic phone number is [phone].

## 2013-01-14 NOTE — Progress Notes (Signed)
[facility] [phone]   [phone]  OFFICE PROGRESS NOTE  TAPPER,DAVID B, MD [address], [place] [address]  DIAGNOSIS AND STAGE: Extensive stage small cell lung cancer with bilateral pulmonary nodules, mediastinal and supraclavicular lymphadenopathy as well as liver and bone metastases diagnosed in July of 2014   PRIOR THERAPY: None   CURRENT THERAPY: Systemic chemotherapy with carboplatin for AUC of 4 on day 1 and etoposide 80 mg/M2 on days 1, 2 and 3 with Neulasta support on day 4. Status post 2 cycles. First cycle was given on 12/04/2012 at [facility]   CHEMOTHERAPY INTENT: Palliative  CURRENT # OF CHEMOTHERAPY CYCLES: 2  CURRENT ANTIEMETICS: Zofran, Compazine and dexamethasone  CURRENT SMOKING STATUS: Currently Nonsmoker, quit smoking 42 years ago.  ORAL CHEMOTHERAPY AND CONSENT: None  CURRENT BISPHOSPHONATES USE: None  PAIN MANAGEMENT: No pain  NARCOTICS INDUCED CONSTIPATION: No constipation  LIVING WILL AND CODE STATUS: Full code   INTERVAL HISTORY: TORIANO AIKEY 79 y.o. male returns to the clinic today for followup visit accompanied by his wife. The patient tolerated the second cycle of his systemic chemotherapy with carboplatin and etoposide fairly well except for mild fatigue. The patient also has shortness breath with exertion. He denied having any significant weight loss or night sweats. He denied having any chest pain but continues to have shortness breath with exertion no cough or hemoptysis. He has no fever or chills. He had repeat CT scan of the chest, abdomen and pelvis performed recently and he is here for evaluation and discussion of his scan results.   MEDICAL HISTORY: Past Medical History  Diagnosis Date  . Hypertension   . Pulmonary fibrosis 2010  . Sleep apnea   . Diabetes mellitus without complication   . GERD (gastroesophageal reflux disease)   . Arthritis   . Anxiety   . Cancer      ALLERGIES:  has No Known Allergies.  MEDICATIONS:  Current Outpatient Prescriptions  Medication Sig Dispense Refill  . albuterol (PROVENTIL) (5 MG/ML) 0.5% nebulizer solution Take 0.5 mLs (2.5 mg total) by nebulization every 6 (six) hours as needed for wheezing or shortness of breath.  20 mL  12  . allopurinol (ZYLOPRIM) 100 MG tablet Take 200 mg by mouth daily.      Marland Kitchen aspirin 81 MG tablet Take 81 mg by mouth daily.      . bumetanide (BUMEX) 1 MG tablet Take 1 mg by mouth daily.      . chlorproMAZINE (THORAZINE) 25 MG tablet Take 25 mg by mouth 3 (three) times daily as needed (hiccups.).      Marland Kitchen colchicine 0.6 MG tablet Take 0.6 mg by mouth 2 (two) times daily as needed (gout).      . diazepam (VALIUM) 5 MG tablet Take 5 mg by mouth at bedtime as needed for sleep.      Marland Kitchen GLUCOSAMINE CHONDROITIN COMPLX PO Take 1 capsule by mouth 3 (three) times daily.      Marland Kitchen HYDROcodone-acetaminophen (NORCO) 10-325 MG per tablet Take 1 tablet by mouth every 6 (six) hours as needed for pain.  120 tablet  0  . Insulin Isophane & Regular (NOVOLIN 70/30 RELION Emmett) Inject 55 Units into the skin 2 (two) times daily.      Marland Kitchen lidocaine-prilocaine (EMLA) cream Apply topically as needed.  30 g  0  . LORazepam (ATIVAN) 1 MG tablet Take 1 tablet (1 mg total) by mouth every  8 (eight) hours as needed for anxiety.  90 tablet  0  . losartan (COZAAR) 100 MG tablet Take 100 mg by mouth daily.      . Magnesium-Zinc (MAGNESIUM-CHELATED ZINC PO) Take 1 tablet by mouth 3 (three) times daily.      . methylPREDNISolone (MEDROL DOSEPAK) 4 MG tablet Take by mouth. follow package directions      . mirtazapine (REMERON) 30 MG tablet Take 1 tablet (30 mg total) by mouth at bedtime.  30 tablet  2  . Multiple Vitamin (MULTIVITAMIN WITH MINERALS) TABS Take 1 tablet by mouth daily.      . penicillin v potassium (VEETID) 500 MG tablet Take 500 mg by mouth 4 (four) times daily. For 7 days.      . penicillin v potassium (VEETID) 500 MG tablet  Take 500 mg by mouth 4 (four) times daily. Take for 7 days      . prochlorperazine (COMPAZINE) 10 MG tablet Take 1 tablet (10 mg total) by mouth every 6 (six) hours as needed.  60 tablet  0   No current facility-administered medications for this visit.    SURGICAL HISTORY:  Past Surgical History  Procedure Laterality Date  . Prostate surgery      Patient wife states they rimmed out prostate    REVIEW OF SYSTEMS:  A comprehensive review of systems was negative except for: Constitutional: positive for fatigue Respiratory: positive for dyspnea on exertion   PHYSICAL EXAMINATION: General appearance: alert, cooperative, fatigued and no distress Head: Normocephalic, without obvious abnormality, atraumatic Neck: no adenopathy Lymph nodes: Cervical, supraclavicular, and axillary nodes normal. Resp: clear to auscultation bilaterally Cardio: regular rate and rhythm, S1, S2 normal, no murmur, click, rub or gallop GI: soft, non-tender; bowel sounds normal; no masses,  no organomegaly Extremities: extremities normal, atraumatic, no cyanosis or edema Neurologic: Alert and oriented X 3, normal strength and tone. Normal symmetric reflexes. Normal coordination and gait  ECOG PERFORMANCE STATUS: 1 - Symptomatic but completely ambulatory  Blood pressure 102/68, pulse 106, temperature 96.9 F (36.1 C), temperature source Oral, resp. rate 20, height 5\' 9"  (1.753 m), weight 174 lb 14.4 oz (79.334 kg).  LABORATORY DATA: Lab Results  Component Value Date   WBC 14.5* 01/14/2013   HGB 11.1* 01/14/2013   HCT 33.7* 01/14/2013   MCV 98.0 01/14/2013   PLT 199 01/14/2013      Chemistry      Component Value Date/Time   NA 137 12/24/2012 1151   NA 128* 12/07/2012 1018   K 4.4 12/24/2012 1151   K 4.8 12/07/2012 1018   CL 97 12/07/2012 1018   CO2 25 12/24/2012 1151   CO2 21 12/07/2012 1018   BUN 18.3 12/24/2012 1151   BUN 35* 12/07/2012 1018   CREATININE 1.4* 12/24/2012 1151   CREATININE 0.95 12/07/2012 1018       Component Value Date/Time   CALCIUM 9.0 12/24/2012 1151   CALCIUM 8.3* 12/07/2012 1018   ALKPHOS 751* 12/24/2012 1151   ALKPHOS 480* 12/05/2012 0428   AST 85* 12/24/2012 1151   AST 118* 12/05/2012 0428   ALT 43 12/24/2012 1151   ALT 79* 12/05/2012 0428   BILITOT 0.79 12/24/2012 1151   BILITOT 2.2* 12/05/2012 0428       RADIOGRAPHIC STUDIES: Ct Chest W Contrast  01/11/2013   *RADIOLOGY REPORT*  Clinical Data:  Small cell lung cancer  CT CHEST, ABDOMEN AND PELVIS WITH CONTRAST  Technique:  Multidetector CT imaging of the chest, abdomen and pelvis  was performed following the standard protocol during bolus administration of intravenous contrast.  Contrast: 80mL OMNIPAQUE IOHEXOL 300 MG/ML  SOLN  Comparison:  Abdomen and pelvis CT from 12/03/1939.  PET CT from 11/15/2012.    CT CHEST  Findings:  The tip of the right-sided Port-A-Cath is positioned in the distal SVC.  Tiny gas locules around the port device are presumably secondary to recent port placement 2 days ago.  There is no axillary lymphadenopathy.  2.1 cm posterior right hilar lymph node is associated with other smaller lymph nodes in the right hilum.  No subcarinal or left hilar lymphadenopathy.  The heart size is normal. Coronary artery calcification is noted. There is no pericardial or pleural effusion.  Lung windows show advanced emphysema bilaterally. 18 mm posterior left upper lobe pulmonary nodule is visible on image 18. 2.3 x 2.0 cm posteromedial right upper lobe nodules seen just posterior to the right hilum.  Peripheral honeycombing is seen in the lungs bilaterally suggesting a degree of underlying UIP.  Bone windows reveal no worrisome lytic or sclerotic osseous lesions.  IMPRESSION: Bilateral pulmonary nodules, suspicious for metastatic disease.  Emphysema with underlying interstitial lung disease.  Peripheral honeycombing suggest a component of underlying UIP.    CT ABDOMEN AND PELVIS  Findings:  Numerous low-density lesions are seen  scattered throughout the liver, as before.  These appear improved in the interval.  13 mm lesion in the tip of the lateral segment left liver was 29 mm previously.  15 mm lesion in the lateral right liver was 23 mm previously.  The spleen is unremarkable.  Stomach is decompressed.  Duodenum, pancreas, and adrenal glands are unremarkable.  Numerous tiny calcified stones (measuring in the 5 mm size range) are noted in the gallbladder.  Cortical scarring is evident within the kidneys bilaterally no dominant renal mass is evident.  Tiny low-density cortical lesions in the left kidney are probably cysts.  No abdominal aortic aneurysm although atherosclerotic calcification is noted in the wall of the abdominal aorta.  There is no retroperitoneal lymphadenopathy.  No gastrohepatic ligament lymphadenopathy.  There is mild lymphadenopathy in the hepatoduodenal ligament with a 2.9 x 1.8 cm lymph node identified in the portocaval space.  Imaging through the pelvis shows no free intraperitoneal fluid. There is no pelvic sidewall lymphadenopathy.  Bladder is moderately distended.  Prostate gland is enlarged.  Advanced diverticulosis noted in the left colon without evidence for diverticulitis.  The terminal ileum and the appendix are normal.  Bone windows reveal no worrisome lytic or sclerotic osseous lesions.  IMPRESSION: Interval decrease in the numerous lesions scattered throughout both hepatic lobes, consistent with improving metastatic disease used.  Interval decrease in hepatoduodenal ligament lymphadenopathy.  Cholelithiasis.  Atherosclerosis.  Diverticulosis without diverticulitis.   Original Report Authenticated By: Kennith Center, M.D.   ASSESSMENT AND PLAN: This is a very pleasant 79 years old white male with extensive stage small cell lung cancer currently undergoing systemic chemotherapy with carboplatin and etoposide status post 2 cycles and tolerating his treatment fairly well. The patient has improvement in his  disease based on the recent results of his scans. I discussed the scan results with the patient and his wife. I recommended for him to continue on systemic chemotherapy with carboplatin and etoposide today as scheduled. The patient would come back for followup visit in 3 weeks with the next cycle of his treatment. He prefers to take his Neulasta injection at [facility] in [facility] and Necessary arrangements  for the patient to receive his injection there.  He was advised to call immediately if he has any concerning symptoms in the interval.  The patient voices understanding of current disease status and treatment options and is in agreement with the current care plan.  All questions were answered. The patient knows to call the clinic with any problems, questions or concerns. We can certainly see the patient much sooner if necessary.  I spent 15 minutes counseling the patient face to face. The total time spent in the appointment was 25 minutes.

## 2013-01-15 ENCOUNTER — Ambulatory Visit (HOSPITAL_BASED_OUTPATIENT_CLINIC_OR_DEPARTMENT_OTHER): Payer: Medicare Other

## 2013-01-15 VITALS — BP 153/93 | HR 75 | Temp 97.8°F | Resp 20

## 2013-01-15 DIAGNOSIS — Z5111 Encounter for antineoplastic chemotherapy: Secondary | ICD-10-CM

## 2013-01-15 DIAGNOSIS — C3410 Malignant neoplasm of upper lobe, unspecified bronchus or lung: Secondary | ICD-10-CM

## 2013-01-15 DIAGNOSIS — C7951 Secondary malignant neoplasm of bone: Secondary | ICD-10-CM

## 2013-01-15 DIAGNOSIS — C787 Secondary malignant neoplasm of liver and intrahepatic bile duct: Secondary | ICD-10-CM

## 2013-01-15 DIAGNOSIS — C3491 Malignant neoplasm of unspecified part of right bronchus or lung: Secondary | ICD-10-CM

## 2013-01-15 MED ORDER — ETOPOSIDE CHEMO INJECTION 1 GM/50ML
80.0000 mg/m2 | Freq: Once | INTRAVENOUS | Status: AC
  Administered 2013-01-15: 160 mg via INTRAVENOUS
  Filled 2013-01-15: qty 8

## 2013-01-15 MED ORDER — SODIUM CHLORIDE 0.9 % IJ SOLN
10.0000 mL | INTRAMUSCULAR | Status: DC | PRN
  Administered 2013-01-15: 10 mL
  Filled 2013-01-15: qty 10

## 2013-01-15 MED ORDER — PROCHLORPERAZINE MALEATE 10 MG PO TABS
10.0000 mg | ORAL_TABLET | Freq: Once | ORAL | Status: AC
  Administered 2013-01-15: 10 mg via ORAL

## 2013-01-15 MED ORDER — HEPARIN SOD (PORK) LOCK FLUSH 100 UNIT/ML IV SOLN
500.0000 [IU] | Freq: Once | INTRAVENOUS | Status: AC | PRN
  Administered 2013-01-15: 500 [IU]
  Filled 2013-01-15: qty 5

## 2013-01-15 MED ORDER — SODIUM CHLORIDE 0.9 % IV SOLN
Freq: Once | INTRAVENOUS | Status: AC
  Administered 2013-01-15: 14:00:00 via INTRAVENOUS

## 2013-01-15 NOTE — Patient Instructions (Addendum)
[facility] Discharge Instructions for Patients Receiving Chemotherapy  Today you received the following chemotherapy agents :  Etoposide.  To help prevent nausea and vomiting after your treatment, we encourage you to take your nausea medication as instructed by your physician.   If you develop nausea and vomiting that is not controlled by your nausea medication, call the clinic.   BELOW ARE SYMPTOMS THAT SHOULD BE REPORTED IMMEDIATELY:  *FEVER GREATER THAN 100.5 F  *CHILLS WITH OR WITHOUT FEVER  NAUSEA AND VOMITING THAT IS NOT CONTROLLED WITH YOUR NAUSEA MEDICATION  *UNUSUAL SHORTNESS OF BREATH  *UNUSUAL BRUISING OR BLEEDING  TENDERNESS IN MOUTH AND THROAT WITH OR WITHOUT PRESENCE OF ULCERS  *URINARY PROBLEMS  *BOWEL PROBLEMS  UNUSUAL RASH Items with * indicate a potential emergency and should be followed up as soon as possible.  Feel free to call the clinic you have any questions or concerns. The clinic phone number is [phone].    

## 2013-01-16 ENCOUNTER — Telehealth: Payer: Self-pay | Admitting: *Deleted

## 2013-01-16 ENCOUNTER — Ambulatory Visit (HOSPITAL_BASED_OUTPATIENT_CLINIC_OR_DEPARTMENT_OTHER): Payer: Medicare Other

## 2013-01-16 VITALS — BP 125/62 | HR 54 | Temp 97.9°F | Resp 24

## 2013-01-16 DIAGNOSIS — Z5111 Encounter for antineoplastic chemotherapy: Secondary | ICD-10-CM

## 2013-01-16 DIAGNOSIS — C3491 Malignant neoplasm of unspecified part of right bronchus or lung: Secondary | ICD-10-CM

## 2013-01-16 DIAGNOSIS — C7951 Secondary malignant neoplasm of bone: Secondary | ICD-10-CM

## 2013-01-16 DIAGNOSIS — C3410 Malignant neoplasm of upper lobe, unspecified bronchus or lung: Secondary | ICD-10-CM

## 2013-01-16 MED ORDER — SODIUM CHLORIDE 0.9 % IV SOLN
Freq: Once | INTRAVENOUS | Status: AC
  Administered 2013-01-16: 15:00:00 via INTRAVENOUS

## 2013-01-16 MED ORDER — HEPARIN SOD (PORK) LOCK FLUSH 100 UNIT/ML IV SOLN
500.0000 [IU] | Freq: Once | INTRAVENOUS | Status: AC | PRN
  Administered 2013-01-16: 500 [IU]
  Filled 2013-01-16: qty 5

## 2013-01-16 MED ORDER — PROCHLORPERAZINE MALEATE 10 MG PO TABS
10.0000 mg | ORAL_TABLET | Freq: Once | ORAL | Status: AC
  Administered 2013-01-16: 10 mg via ORAL

## 2013-01-16 MED ORDER — SODIUM CHLORIDE 0.9 % IJ SOLN
10.0000 mL | INTRAMUSCULAR | Status: DC | PRN
  Administered 2013-01-16: 10 mL
  Filled 2013-01-16: qty 10

## 2013-01-16 MED ORDER — SODIUM CHLORIDE 0.9 % IV SOLN
80.0000 mg/m2 | Freq: Once | INTRAVENOUS | Status: AC
  Administered 2013-01-16: 160 mg via INTRAVENOUS
  Filled 2013-01-16: qty 8

## 2013-01-16 NOTE — Patient Instructions (Addendum)
[facility] Discharge Instructions for Patients Receiving Chemotherapy  Today you received the following chemotherapy agents:  etoposide  To help prevent nausea and vomiting after your treatment, we encourage you to take your nausea medication.  Take it as often as prescribed.     If you develop nausea and vomiting that is not controlled by your nausea medication, call the clinic. If it is after clinic hours your family physician or the after hours number for the clinic or go to the Emergency Department.   BELOW ARE SYMPTOMS THAT SHOULD BE REPORTED IMMEDIATELY:  *FEVER GREATER THAN 100.5 F  *CHILLS WITH OR WITHOUT FEVER  NAUSEA AND VOMITING THAT IS NOT CONTROLLED WITH YOUR NAUSEA MEDICATION  *UNUSUAL SHORTNESS OF BREATH  *UNUSUAL BRUISING OR BLEEDING  TENDERNESS IN MOUTH AND THROAT WITH OR WITHOUT PRESENCE OF ULCERS  *URINARY PROBLEMS  *BOWEL PROBLEMS  UNUSUAL RASH Items with * indicate a potential emergency and should be followed up as soon as possible.  Feel free to call the clinic you have any questions or concerns. The clinic phone number is [phone].   I have been informed and understand all the instructions given to me. I know to contact the clinic, my physician, or go to the Emergency Department if any problems should occur. I do not have any questions at this time, but understand that I may call the clinic during office hours   should I have any questions or need assistance in obtaining follow up care.    __________________________________________  _____________  __________ Signature of Patient or Authorized Representative            Date                   Time    __________________________________________ Nurse's Signature    

## 2013-01-16 NOTE — Telephone Encounter (Signed)
Per AJ, call pt to see if blood sugar has decreased (called last night stating bl sugar was in the 400's).  Called home #, no answer, left msg with instructions to call PCP if bl sugar is still in the 400's and to call back to let us know what his bl sugar is. SLJ

## 2013-01-17 ENCOUNTER — Telehealth: Payer: Self-pay | Admitting: Medical Oncology

## 2013-01-17 ENCOUNTER — Ambulatory Visit: Payer: Medicare Other

## 2013-01-17 NOTE — Telephone Encounter (Signed)
I returned wife's call and told her i called [facility] and was told that Phillip [name] is on the shedule today for neulasta and she has order. Wife wants to see if he can get neulasta tomorrow and I told her to call [facility].

## 2013-01-18 DIAGNOSIS — C3490 Malignant neoplasm of unspecified part of unspecified bronchus or lung: Secondary | ICD-10-CM

## 2013-01-18 DIAGNOSIS — D702 Other drug-induced agranulocytosis: Secondary | ICD-10-CM

## 2013-01-18 DIAGNOSIS — C787 Secondary malignant neoplasm of liver and intrahepatic bile duct: Secondary | ICD-10-CM

## 2013-01-18 DIAGNOSIS — T451X5A Adverse effect of antineoplastic and immunosuppressive drugs, initial encounter: Secondary | ICD-10-CM

## 2013-01-22 ENCOUNTER — Telehealth: Payer: Self-pay | Admitting: *Deleted

## 2013-01-22 NOTE — Telephone Encounter (Signed)
Phillip [name] called asking for an order for labs to be drawn every Monday for a cbc-diff and CMET.  Fax a signed EPIC order to [phone].  Can reach [facility] at [phone] ext. [phone].

## 2013-01-22 NOTE — Telephone Encounter (Signed)
Dr. Arbutus Ped confirmed his order for weekly lab at [facility] does not end.  Phillip [name] reports the only order they found reads 12-31-2012 draw date and does not read standing weekly order.  Faxed Physician order form with instructions for "Standing orders/ weekly cbc-diff and CMET"  Faxed to [facility] at [phone].

## 2013-01-23 ENCOUNTER — Encounter: Payer: Self-pay | Admitting: Physician Assistant

## 2013-01-25 ENCOUNTER — Encounter: Payer: Self-pay | Admitting: Internal Medicine

## 2013-01-30 ENCOUNTER — Ambulatory Visit: Payer: Medicare Other | Admitting: Internal Medicine

## 2013-02-01 ENCOUNTER — Telehealth: Payer: Self-pay | Admitting: Medical Oncology

## 2013-02-01 ENCOUNTER — Other Ambulatory Visit: Payer: Self-pay | Admitting: *Deleted

## 2013-02-01 NOTE — Telephone Encounter (Signed)
Left message with wife about labs and told her to call if she has any questions. ( labs drawn at [facility] and sent to be scanned into chart.

## 2013-02-04 ENCOUNTER — Encounter: Payer: Self-pay | Admitting: Physician Assistant

## 2013-02-04 ENCOUNTER — Ambulatory Visit (HOSPITAL_BASED_OUTPATIENT_CLINIC_OR_DEPARTMENT_OTHER): Payer: Medicare Other

## 2013-02-04 ENCOUNTER — Ambulatory Visit: Payer: Medicare Other | Admitting: Internal Medicine

## 2013-02-04 ENCOUNTER — Ambulatory Visit (HOSPITAL_BASED_OUTPATIENT_CLINIC_OR_DEPARTMENT_OTHER): Payer: Medicare Other | Admitting: Physician Assistant

## 2013-02-04 ENCOUNTER — Other Ambulatory Visit (HOSPITAL_BASED_OUTPATIENT_CLINIC_OR_DEPARTMENT_OTHER): Payer: Medicare Other

## 2013-02-04 ENCOUNTER — Telehealth: Payer: Self-pay | Admitting: *Deleted

## 2013-02-04 ENCOUNTER — Telehealth: Payer: Self-pay | Admitting: Internal Medicine

## 2013-02-04 VITALS — BP 126/79 | HR 75 | Temp 97.4°F | Resp 20 | Ht 69.0 in | Wt 168.1 lb

## 2013-02-04 DIAGNOSIS — R21 Rash and other nonspecific skin eruption: Secondary | ICD-10-CM

## 2013-02-04 DIAGNOSIS — C3410 Malignant neoplasm of upper lobe, unspecified bronchus or lung: Secondary | ICD-10-CM

## 2013-02-04 DIAGNOSIS — C7800 Secondary malignant neoplasm of unspecified lung: Secondary | ICD-10-CM

## 2013-02-04 DIAGNOSIS — C787 Secondary malignant neoplasm of liver and intrahepatic bile duct: Secondary | ICD-10-CM

## 2013-02-04 DIAGNOSIS — Z5111 Encounter for antineoplastic chemotherapy: Secondary | ICD-10-CM

## 2013-02-04 DIAGNOSIS — C3491 Malignant neoplasm of unspecified part of right bronchus or lung: Secondary | ICD-10-CM

## 2013-02-04 DIAGNOSIS — C7951 Secondary malignant neoplasm of bone: Secondary | ICD-10-CM

## 2013-02-04 DIAGNOSIS — C3490 Malignant neoplasm of unspecified part of unspecified bronchus or lung: Secondary | ICD-10-CM

## 2013-02-04 LAB — COMPREHENSIVE METABOLIC PANEL (CC13)
ALT: 55 U/L (ref 0–55)
Albumin: 2.3 g/dL — ABNORMAL LOW (ref 3.5–5.0)
CO2: 26 mEq/L (ref 22–29)
Calcium: 8.6 mg/dL (ref 8.4–10.4)
Chloride: 104 mEq/L (ref 98–109)
Glucose: 203 mg/dl — ABNORMAL HIGH (ref 70–140)
Potassium: 4.1 mEq/L (ref 3.5–5.1)
Sodium: 139 mEq/L (ref 136–145)
Total Bilirubin: 1.31 mg/dL — ABNORMAL HIGH (ref 0.20–1.20)
Total Protein: 8.3 g/dL (ref 6.4–8.3)

## 2013-02-04 LAB — CBC WITH DIFFERENTIAL/PLATELET
BASO%: 0.3 % (ref 0.0–2.0)
Eosinophils Absolute: 0 10*3/uL (ref 0.0–0.5)
MCHC: 32 g/dL (ref 32.0–36.0)
MONO#: 1.1 10*3/uL — ABNORMAL HIGH (ref 0.1–0.9)
NEUT#: 6.9 10*3/uL — ABNORMAL HIGH (ref 1.5–6.5)
Platelets: 192 10*3/uL (ref 140–400)
RBC: 2.87 10*6/uL — ABNORMAL LOW (ref 4.20–5.82)
RDW: 22.7 % — ABNORMAL HIGH (ref 11.0–14.6)
WBC: 9.6 10*3/uL (ref 4.0–10.3)
lymph#: 1.5 10*3/uL (ref 0.9–3.3)

## 2013-02-04 MED ORDER — SODIUM CHLORIDE 0.9 % IV SOLN
300.0000 mg | Freq: Once | INTRAVENOUS | Status: AC
  Administered 2013-02-04: 300 mg via INTRAVENOUS
  Filled 2013-02-04: qty 30

## 2013-02-04 MED ORDER — ONDANSETRON 16 MG/50ML IVPB (CHCC)
INTRAVENOUS | Status: AC
  Filled 2013-02-04: qty 16

## 2013-02-04 MED ORDER — SODIUM CHLORIDE 0.9 % IV SOLN
80.0000 mg/m2 | Freq: Once | INTRAVENOUS | Status: AC
  Administered 2013-02-04: 160 mg via INTRAVENOUS
  Filled 2013-02-04: qty 8

## 2013-02-04 MED ORDER — HEPARIN SOD (PORK) LOCK FLUSH 100 UNIT/ML IV SOLN
500.0000 [IU] | Freq: Once | INTRAVENOUS | Status: AC | PRN
  Administered 2013-02-04: 500 [IU]
  Filled 2013-02-04: qty 5

## 2013-02-04 MED ORDER — SODIUM CHLORIDE 0.9 % IV SOLN
Freq: Once | INTRAVENOUS | Status: AC
  Administered 2013-02-04: 14:00:00 via INTRAVENOUS

## 2013-02-04 MED ORDER — DEXAMETHASONE SODIUM PHOSPHATE 20 MG/5ML IJ SOLN
INTRAMUSCULAR | Status: AC
  Filled 2013-02-04: qty 5

## 2013-02-04 MED ORDER — ONDANSETRON 16 MG/50ML IVPB (CHCC)
16.0000 mg | Freq: Once | INTRAVENOUS | Status: AC
  Administered 2013-02-04: 16 mg via INTRAVENOUS

## 2013-02-04 MED ORDER — SODIUM CHLORIDE 0.9 % IJ SOLN
10.0000 mL | INTRAMUSCULAR | Status: DC | PRN
  Administered 2013-02-04: 10 mL
  Filled 2013-02-04: qty 10

## 2013-02-04 MED ORDER — DEXAMETHASONE SODIUM PHOSPHATE 20 MG/5ML IJ SOLN
20.0000 mg | Freq: Once | INTRAMUSCULAR | Status: AC
  Administered 2013-02-04: 20 mg via INTRAVENOUS

## 2013-02-04 NOTE — Progress Notes (Addendum)
[facility] [phone]   [phone]  SHARED VISIT PROGRESS NOTE  TAPPER,DAVID B, MD [address] [place] [address]  DIAGNOSIS AND STAGE: Extensive stage small cell lung cancer with bilateral pulmonary nodules, mediastinal and supraclavicular lymphadenopathy as well as liver and bone metastases diagnosed in July of 2014   PRIOR THERAPY: None   CURRENT THERAPY: Systemic chemotherapy with carboplatin for AUC of 4 on day 1 and etoposide 80 mg/M2 on days 1, 2 and 3 with Neulasta support on day 4. Status post 3 cycles. First cycle was given on 12/04/2012 at [facility].   CHEMOTHERAPY INTENT: Palliative  CURRENT # OF CHEMOTHERAPY CYCLES: 4  CURRENT ANTIEMETICS: Zofran, Compazine and dexamethasone  CURRENT SMOKING STATUS: Currently Nonsmoker, quit smoking 42 years ago.  ORAL CHEMOTHERAPY AND CONSENT: None  CURRENT BISPHOSPHONATES USE: None  PAIN MANAGEMENT: No pain  NARCOTICS INDUCED CONSTIPATION: No constipation  LIVING WILL AND CODE STATUS: Full code   INTERVAL HISTORY: Phillip [name] 79 y.o. male returns to the clinic today for followup visit accompanied by his wife. He complains of generalized weakness. He complains of some intermittent right mid to lower lateral abdominal pain. He was having some problems with constipation that was relived with OTC laxative ( Ex-Lax). Once the constipation was resolved so did the right sided abdominal pain. He notes a "rash" that has been present on his arms for several weeks. It is slightly "itchy". The patient tolerated the third cycle of his systemic chemotherapy with carboplatin and etoposide fairly well except for mild fatigue. He denied having any  night sweats. He denied having any chest pain but continues to have shortness breath with exertion no cough or hemoptysis. He has no fever or chills. His appetite is decreased and he has lost some weight since his last office visit  MEDICAL  HISTORY: Past Medical History  Diagnosis Date  . Hypertension   . Pulmonary fibrosis 2010  . Sleep apnea   . Diabetes mellitus without complication   . GERD (gastroesophageal reflux disease)   . Arthritis   . Anxiety   . Cancer     ALLERGIES:  has No Known Allergies.  MEDICATIONS:  Current Outpatient Prescriptions  Medication Sig Dispense Refill  . albuterol (PROVENTIL) (5 MG/ML) 0.5% nebulizer solution Take 0.5 mLs (2.5 mg total) by nebulization every 6 (six) hours as needed for wheezing or shortness of breath.  20 mL  12  . allopurinol (ZYLOPRIM) 100 MG tablet Take 200 mg by mouth daily.      Marland Kitchen aspirin 81 MG tablet Take 81 mg by mouth daily.      . bumetanide (BUMEX) 1 MG tablet Take 1 mg by mouth daily.      . chlorproMAZINE (THORAZINE) 25 MG tablet Take 25 mg by mouth 3 (three) times daily as needed (hiccups.).      Marland Kitchen colchicine 0.6 MG tablet Take 0.6 mg by mouth 2 (two) times daily as needed (gout).      . diazepam (VALIUM) 5 MG tablet Take 5 mg by mouth at bedtime as needed for sleep.      Marland Kitchen GLUCOSAMINE CHONDROITIN COMPLX PO Take 1 capsule by mouth 3 (three) times daily.      Marland Kitchen HYDROcodone-acetaminophen (NORCO) 10-325 MG per tablet Take 1 tablet by mouth every 6 (six) hours as needed for pain.  120 tablet  0  . Insulin Isophane & Regular (NOVOLIN 70/30 RELION [place]) Inject 55 Units  into the skin 2 (two) times daily.      Marland Kitchen lidocaine-prilocaine (EMLA) cream Apply topically as needed.  30 g  0  . LORazepam (ATIVAN) 1 MG tablet Take 1 tablet (1 mg total) by mouth every 8 (eight) hours as needed for anxiety.  90 tablet  0  . losartan (COZAAR) 100 MG tablet Take 100 mg by mouth daily.      . Magnesium-Zinc (MAGNESIUM-CHELATED ZINC PO) Take 1 tablet by mouth 3 (three) times daily.      . methylPREDNISolone (MEDROL DOSEPAK) 4 MG tablet Take by mouth. follow package directions      . mirtazapine (REMERON) 30 MG tablet Take 1 tablet (30 mg total) by mouth at bedtime.  30 tablet  2  .  Multiple Vitamin (MULTIVITAMIN WITH MINERALS) TABS Take 1 tablet by mouth daily.      . penicillin v potassium (VEETID) 500 MG tablet Take 500 mg by mouth 4 (four) times daily. For 7 days.      . penicillin v potassium (VEETID) 500 MG tablet Take 500 mg by mouth 4 (four) times daily. Take for 7 days      . prochlorperazine (COMPAZINE) 10 MG tablet Take 1 tablet (10 mg total) by mouth every 6 (six) hours as needed.  60 tablet  0   No current facility-administered medications for this visit.   Facility-Administered Medications Ordered in Other Visits  Medication Dose Route Frequency Provider Last Rate Last Dose  . CARBOplatin (PARAPLATIN) 390 mg in sodium chloride 0.9 % 100 mL chemo infusion  390 mg Intravenous Once Si Gaul, MD      . Dexamethasone Sodium Phosphate (DECADRON) injection 20 mg  20 mg Intravenous Once Si Gaul, MD      . etoposide (VEPESID) 200 mg in sodium chloride 0.9 % 500 mL chemo infusion  100 mg/m2 (Order-Specific) Intravenous Once Si Gaul, MD      . heparin lock flush 100 unit/mL  500 Units Intracatheter Once PRN Si Gaul, MD      . ondansetron (ZOFRAN) IVPB 16 mg  16 mg Intravenous Once Si Gaul, MD      . sodium chloride 0.9 % injection 10 mL  10 mL Intracatheter PRN Si Gaul, MD        SURGICAL HISTORY:  Past Surgical History  Procedure Laterality Date  . Prostate surgery      Patient wife states they rimmed out prostate    REVIEW OF SYSTEMS:  A comprehensive review of systems was negative except for: Constitutional: positive for anorexia, fatigue and weight loss Respiratory: positive for dyspnea on exertion Gastrointestinal: positive for constipation   PHYSICAL EXAMINATION: General appearance: alert, cooperative, fatigued and no distress Head: Normocephalic, without obvious abnormality, atraumatic Neck: no adenopathy Lymph nodes: Cervical, supraclavicular, and axillary nodes normal. Resp: clear to auscultation  bilaterally Cardio: regular rate and rhythm, S1, S2 normal, no murmur, click, rub or gallop GI: soft, non-tender; bowel sounds normal; no masses,  no organomegaly Extremities: extremities normal, atraumatic, no cyanosis or edema Neurologic: Alert and oriented X 3, normal strength and tone. Normal symmetric reflexes. Normal coordination and gait Skin: generally dry, scattered areas of erythema, no weight lesions  ECOG PERFORMANCE STATUS: 1 - Symptomatic but completely ambulatory  Blood pressure 126/79, pulse 75, temperature 97.4 F (36.3 C), temperature source Oral, resp. rate 20, height 5\' 9"  (1.753 m), weight 168 lb 1.6 oz (76.25 kg).  LABORATORY DATA: Lab Results  Component Value Date   WBC 9.6 02/04/2013  HGB 9.6* 02/04/2013   HCT 30.0* 02/04/2013   MCV 104.5* 02/04/2013   PLT 192 02/04/2013      Chemistry      Component Value Date/Time   NA 139 02/04/2013 1154   NA 128* 12/07/2012 1018   K 4.1 02/04/2013 1154   K 4.8 12/07/2012 1018   CL 97 12/07/2012 1018   CO2 26 02/04/2013 1154   CO2 21 12/07/2012 1018   BUN 15.8 02/04/2013 1154   BUN 35* 12/07/2012 1018   CREATININE 1.4* 02/04/2013 1154   CREATININE 0.95 12/07/2012 1018      Component Value Date/Time   CALCIUM 8.6 02/04/2013 1154   CALCIUM 8.3* 12/07/2012 1018   ALKPHOS 611* 02/04/2013 1154   ALKPHOS 480* 12/05/2012 0428   AST 102* 02/04/2013 1154   AST 118* 12/05/2012 0428   ALT 55 02/04/2013 1154   ALT 79* 12/05/2012 0428   BILITOT 1.31* 02/04/2013 1154   BILITOT 2.2* 12/05/2012 0428       RADIOGRAPHIC STUDIES: Ct Chest W Contrast  01/11/2013   *RADIOLOGY REPORT*  Clinical Data:  Small cell lung cancer  CT CHEST, ABDOMEN AND PELVIS WITH CONTRAST  Technique:  Multidetector CT imaging of the chest, abdomen and pelvis was performed following the standard protocol during bolus administration of intravenous contrast.  Contrast: 80mL OMNIPAQUE IOHEXOL 300 MG/ML  SOLN  Comparison:  Abdomen and pelvis CT from 12/03/1939.  PET CT from  11/15/2012.    CT CHEST  Findings:  The tip of the right-sided Port-A-Cath is positioned in the distal SVC.  Tiny gas locules around the port device are presumably secondary to recent port placement 2 days ago.  There is no axillary lymphadenopathy.  2.1 cm posterior right hilar lymph node is associated with other smaller lymph nodes in the right hilum.  No subcarinal or left hilar lymphadenopathy.  The heart size is normal. Coronary artery calcification is noted. There is no pericardial or pleural effusion.  Lung windows show advanced emphysema bilaterally. 18 mm posterior left upper lobe pulmonary nodule is visible on image 18. 2.3 x 2.0 cm posteromedial right upper lobe nodules seen just posterior to the right hilum.  Peripheral honeycombing is seen in the lungs bilaterally suggesting a degree of underlying UIP.  Bone windows reveal no worrisome lytic or sclerotic osseous lesions.  IMPRESSION: Bilateral pulmonary nodules, suspicious for metastatic disease.  Emphysema with underlying interstitial lung disease.  Peripheral honeycombing suggest a component of underlying UIP.    CT ABDOMEN AND PELVIS  Findings:  Numerous low-density lesions are seen scattered throughout the liver, as before.  These appear improved in the interval.  13 mm lesion in the tip of the lateral segment left liver was 29 mm previously.  15 mm lesion in the lateral right liver was 23 mm previously.  The spleen is unremarkable.  Stomach is decompressed.  Duodenum, pancreas, and adrenal glands are unremarkable.  Numerous tiny calcified stones (measuring in the 5 mm size range) are noted in the gallbladder.  Cortical scarring is evident within the kidneys bilaterally no dominant renal mass is evident.  Tiny low-density cortical lesions in the left kidney are probably cysts.  No abdominal aortic aneurysm although atherosclerotic calcification is noted in the wall of the abdominal aorta.  There is no retroperitoneal lymphadenopathy.  No  gastrohepatic ligament lymphadenopathy.  There is mild lymphadenopathy in the hepatoduodenal ligament with a 2.9 x 1.8 cm lymph node identified in the portocaval space.  Imaging through the pelvis shows no  free intraperitoneal fluid. There is no pelvic sidewall lymphadenopathy.  Bladder is moderately distended.  Prostate gland is enlarged.  Advanced diverticulosis noted in the left colon without evidence for diverticulitis.  The terminal ileum and the appendix are normal.  Bone windows reveal no worrisome lytic or sclerotic osseous lesions.  IMPRESSION: Interval decrease in the numerous lesions scattered throughout both hepatic lobes, consistent with improving metastatic disease used.  Interval decrease in hepatoduodenal ligament lymphadenopathy.  Cholelithiasis.  Atherosclerosis.  Diverticulosis without diverticulitis.   Original Report Authenticated By: Kennith Center, M.D.   ASSESSMENT AND PLAN: This is a very pleasant 79 years old white male with extensive stage small cell lung cancer currently undergoing systemic chemotherapy with carboplatin and etoposide status post 3 cycles and tolerating his treatment fairly well. The patient has improvement in his disease based on the recent results of his scans. Patient was discussed with him also seen by Dr. Arbutus Ped. Labs were reviewed. He will proceed with cycle #4 of his systemic chemotherapy with carboplatin for an AUC of 4 given on day 1 and etoposide at 80 mg per meter squared given on days 1, 2 and 3 with Neulasta support given on day 4. As with his last cycle of chemotherapy he will receive his Neulasta injection at the [facility] in [place], [place]. He also gets his weekly labs drawn at the cancer Center in [place] with the results faxed to Dr. Arbutus Ped. He will return in 3 weeks with repeat CBC differential, C. met and CT the chest, abdomen and pelvis without contrast, in deference to his mild renal insufficiency with a creatinine of 1.4, to  reevaluate his disease. Regarding the patient's skin rash this may be from dry skin and itching. Patient is advised to use a topical emmoliant and and we will continue to monitor this on subsequent visits. He is encouraged to increase his by mouth intake.  Laural Benes, Yuliza Cara E, PA-C    Orders for his Neulasta injection on 02/07/2013 as well as weekly labs were faxed to the [facility] need [place].  He was advised to call immediately if he has any concerning symptoms in the interval.  The patient voices understanding of current disease status and treatment options and is in agreement with the current care plan.  All questions were answered. The patient knows to call the clinic with any problems, questions or concerns. We can certainly see the patient much sooner if necessary.  ADDENDUM: Hematology/Oncology Attending: I have a face-to-face encounter with the patient today. I recommended his care. This is a very pleasant 79 years old white male with extensive stage small cell lung cancer currently undergoing systemic chemotherapy with reduced dose carboplatin and etoposide status post 3 cycles. The patient tolerated the last cycle of his treatment much better and has no significant complaints except for the fatigue. His lab work is good today. We will proceed with cycle #4 today as scheduled. The patient would come back for followup visit in 3 weeks with repeat CT scan of the chest, abdomen and pelvis for restaging of his disease. He was advised to call immediately if he has any concerning symptoms in the interval. Lajuana Matte., MD 02/04/2013

## 2013-02-04 NOTE — Telephone Encounter (Signed)
Per staff message and POF I have scheduled appts.  JMW  

## 2013-02-04 NOTE — Patient Instructions (Signed)
Remember to go to the Cancer Center in [place] on 02/07/13 for your Neulasta injection as well as to continue getting your weekly labs and have the results faxed to Dr. Arbutus Ped Followup in 3 weeks with a restaging CT scan of your chest, abdomen and pelvis to reevaluate your disease

## 2013-02-04 NOTE — Patient Instructions (Addendum)
[facility] Discharge Instructions for Patients Receiving Chemotherapy  Today you received the following chemotherapy agents :  Carboplatin,  Etoposide.  To help prevent nausea and vomiting after your treatment, we encourage you to take your nausea medication as instructed by your physician.   If you develop nausea and vomiting that is not controlled by your nausea medication, call the clinic.   BELOW ARE SYMPTOMS THAT SHOULD BE REPORTED IMMEDIATELY:  *FEVER GREATER THAN 100.5 F  *CHILLS WITH OR WITHOUT FEVER  NAUSEA AND VOMITING THAT IS NOT CONTROLLED WITH YOUR NAUSEA MEDICATION  *UNUSUAL SHORTNESS OF BREATH  *UNUSUAL BRUISING OR BLEEDING  TENDERNESS IN MOUTH AND THROAT WITH OR WITHOUT PRESENCE OF ULCERS  *URINARY PROBLEMS  *BOWEL PROBLEMS  UNUSUAL RASH Items with * indicate a potential emergency and should be followed up as soon as possible.  Feel free to call the clinic you have any questions or concerns. The clinic phone number is [phone].    

## 2013-02-04 NOTE — Telephone Encounter (Signed)
Gave pt appt for lab and MD , emailed Marcelino Duster for chemo fotr October Ct will be done @ [facility], neulata @ [place], pt aware of all appts

## 2013-02-05 ENCOUNTER — Other Ambulatory Visit: Payer: Self-pay | Admitting: Internal Medicine

## 2013-02-05 ENCOUNTER — Telehealth: Payer: Self-pay | Admitting: Internal Medicine

## 2013-02-05 ENCOUNTER — Ambulatory Visit (HOSPITAL_BASED_OUTPATIENT_CLINIC_OR_DEPARTMENT_OTHER): Payer: Medicare Other

## 2013-02-05 VITALS — BP 127/66 | HR 60 | Temp 97.6°F | Resp 18

## 2013-02-05 DIAGNOSIS — C7A1 Malignant poorly differentiated neuroendocrine tumors: Secondary | ICD-10-CM

## 2013-02-05 DIAGNOSIS — C787 Secondary malignant neoplasm of liver and intrahepatic bile duct: Secondary | ICD-10-CM

## 2013-02-05 DIAGNOSIS — Z5111 Encounter for antineoplastic chemotherapy: Secondary | ICD-10-CM

## 2013-02-05 DIAGNOSIS — C7B8 Other secondary neuroendocrine tumors: Secondary | ICD-10-CM

## 2013-02-05 DIAGNOSIS — C3491 Malignant neoplasm of unspecified part of right bronchus or lung: Secondary | ICD-10-CM

## 2013-02-05 MED ORDER — SODIUM CHLORIDE 0.9 % IV SOLN
Freq: Once | INTRAVENOUS | Status: AC
  Administered 2013-02-05: 13:00:00 via INTRAVENOUS

## 2013-02-05 MED ORDER — SODIUM CHLORIDE 0.9 % IV SOLN
80.0000 mg/m2 | Freq: Once | INTRAVENOUS | Status: AC
  Administered 2013-02-05: 160 mg via INTRAVENOUS
  Filled 2013-02-05: qty 8

## 2013-02-05 MED ORDER — SODIUM CHLORIDE 0.9 % IJ SOLN
10.0000 mL | INTRAMUSCULAR | Status: DC | PRN
  Administered 2013-02-05: 10 mL
  Filled 2013-02-05: qty 10

## 2013-02-05 MED ORDER — PROCHLORPERAZINE MALEATE 10 MG PO TABS
10.0000 mg | ORAL_TABLET | Freq: Once | ORAL | Status: AC
  Administered 2013-02-05: 10 mg via ORAL

## 2013-02-05 MED ORDER — PROCHLORPERAZINE MALEATE 10 MG PO TABS
ORAL_TABLET | ORAL | Status: AC
  Filled 2013-02-05: qty 1

## 2013-02-05 MED ORDER — HEPARIN SOD (PORK) LOCK FLUSH 100 UNIT/ML IV SOLN
500.0000 [IU] | Freq: Once | INTRAVENOUS | Status: AC | PRN
  Administered 2013-02-05: 500 [IU]
  Filled 2013-02-05: qty 5

## 2013-02-05 NOTE — Telephone Encounter (Signed)
Talked to pt's wife, she has appts for lab, md  and chemo for september and October 2014

## 2013-02-05 NOTE — Patient Instructions (Addendum)
[facility] Discharge Instructions for Patients Receiving Chemotherapy  Today you received the following chemotherapy agents: Etoposide.  To help prevent nausea and vomiting after your treatment, we encourage you to take your nausea medication as prescribed.   If you develop nausea and vomiting that is not controlled by your nausea medication, call the clinic.   BELOW ARE SYMPTOMS THAT SHOULD BE REPORTED IMMEDIATELY:  *FEVER GREATER THAN 100.5 F  *CHILLS WITH OR WITHOUT FEVER  NAUSEA AND VOMITING THAT IS NOT CONTROLLED WITH YOUR NAUSEA MEDICATION  *UNUSUAL SHORTNESS OF BREATH  *UNUSUAL BRUISING OR BLEEDING  TENDERNESS IN MOUTH AND THROAT WITH OR WITHOUT PRESENCE OF ULCERS  *URINARY PROBLEMS  *BOWEL PROBLEMS  UNUSUAL RASH Items with * indicate a potential emergency and should be followed up as soon as possible.  Feel free to call the clinic you have any questions or concerns. The clinic phone number is [phone].    

## 2013-02-06 ENCOUNTER — Ambulatory Visit (HOSPITAL_BASED_OUTPATIENT_CLINIC_OR_DEPARTMENT_OTHER): Payer: Medicare Other

## 2013-02-06 VITALS — BP 111/72 | HR 101 | Temp 98.2°F | Resp 20

## 2013-02-06 DIAGNOSIS — Z5111 Encounter for antineoplastic chemotherapy: Secondary | ICD-10-CM

## 2013-02-06 DIAGNOSIS — C3410 Malignant neoplasm of upper lobe, unspecified bronchus or lung: Secondary | ICD-10-CM

## 2013-02-06 DIAGNOSIS — C3491 Malignant neoplasm of unspecified part of right bronchus or lung: Secondary | ICD-10-CM

## 2013-02-06 MED ORDER — PROCHLORPERAZINE MALEATE 10 MG PO TABS
ORAL_TABLET | ORAL | Status: AC
  Filled 2013-02-06: qty 1

## 2013-02-06 MED ORDER — PROCHLORPERAZINE MALEATE 10 MG PO TABS
10.0000 mg | ORAL_TABLET | Freq: Once | ORAL | Status: AC
  Administered 2013-02-06: 10 mg via ORAL

## 2013-02-06 MED ORDER — SODIUM CHLORIDE 0.9 % IV SOLN
80.0000 mg/m2 | Freq: Once | INTRAVENOUS | Status: AC
  Administered 2013-02-06: 160 mg via INTRAVENOUS
  Filled 2013-02-06: qty 8

## 2013-02-06 MED ORDER — SODIUM CHLORIDE 0.9 % IV SOLN
Freq: Once | INTRAVENOUS | Status: AC
  Administered 2013-02-06: 14:00:00 via INTRAVENOUS

## 2013-02-06 MED ORDER — HEPARIN SOD (PORK) LOCK FLUSH 100 UNIT/ML IV SOLN
500.0000 [IU] | Freq: Once | INTRAVENOUS | Status: AC | PRN
  Administered 2013-02-06: 500 [IU]
  Filled 2013-02-06: qty 5

## 2013-02-06 MED ORDER — SODIUM CHLORIDE 0.9 % IJ SOLN
10.0000 mL | INTRAMUSCULAR | Status: DC | PRN
  Administered 2013-02-06: 10 mL
  Filled 2013-02-06: qty 10

## 2013-02-06 NOTE — Patient Instructions (Addendum)
[facility] Discharge Instructions for Patients Receiving Chemotherapy  Today you received the following chemotherapy agents VP 16 (Etoposide) To help prevent nausea and vomiting after your treatment, we encourage you to take your nausea medication as prescribed.   If you develop nausea and vomiting that is not controlled by your nausea medication, call the clinic.   BELOW ARE SYMPTOMS THAT SHOULD BE REPORTED IMMEDIATELY:  *FEVER GREATER THAN 100.5 F  *CHILLS WITH OR WITHOUT FEVER  NAUSEA AND VOMITING THAT IS NOT CONTROLLED WITH YOUR NAUSEA MEDICATION  *UNUSUAL SHORTNESS OF BREATH  *UNUSUAL BRUISING OR BLEEDING  TENDERNESS IN MOUTH AND THROAT WITH OR WITHOUT PRESENCE OF ULCERS  *URINARY PROBLEMS  *BOWEL PROBLEMS  UNUSUAL RASH Items with * indicate a potential emergency and should be followed up as soon as possible.  Feel free to call the clinic you have any questions or concerns. The clinic phone number is [phone].    

## 2013-02-07 DIAGNOSIS — D702 Other drug-induced agranulocytosis: Secondary | ICD-10-CM

## 2013-02-07 DIAGNOSIS — T451X5A Adverse effect of antineoplastic and immunosuppressive drugs, initial encounter: Secondary | ICD-10-CM

## 2013-02-12 ENCOUNTER — Telehealth: Payer: Self-pay | Admitting: *Deleted

## 2013-02-12 NOTE — Telephone Encounter (Signed)
weekly lab work results (CBC, CMET) dated 02/11/13 drawn at [facility] hospital given to Dr Donnald Garre to review.  SLJ

## 2013-02-15 ENCOUNTER — Telehealth: Payer: Self-pay | Admitting: *Deleted

## 2013-02-15 NOTE — Telephone Encounter (Signed)
Weekly lab work drawn 02/11/13  at [facility] given to Dr Donnald Garre to review.

## 2013-02-18 ENCOUNTER — Telehealth: Payer: Self-pay | Admitting: Medical Oncology

## 2013-02-19 ENCOUNTER — Telehealth: Payer: Self-pay | Admitting: Medical Oncology

## 2013-02-19 ENCOUNTER — Encounter: Payer: Self-pay | Admitting: Internal Medicine

## 2013-02-19 NOTE — Telephone Encounter (Signed)
Pt scheduled for transfusion in [place].

## 2013-02-19 NOTE — Telephone Encounter (Signed)
scheduled for transfusion in [place].

## 2013-02-21 ENCOUNTER — Ambulatory Visit (HOSPITAL_COMMUNITY)
Admission: RE | Admit: 2013-02-21 | Discharge: 2013-02-21 | Disposition: A | Discharge status: Home / Self Care | Payer: Medicare Other | Source: Ambulatory Visit | Attending: Physician Assistant | Admitting: Physician Assistant

## 2013-02-21 DIAGNOSIS — R161 Splenomegaly, not elsewhere classified: Secondary | ICD-10-CM | POA: Insufficient documentation

## 2013-02-21 DIAGNOSIS — C3490 Malignant neoplasm of unspecified part of unspecified bronchus or lung: Secondary | ICD-10-CM

## 2013-02-21 DIAGNOSIS — K5730 Diverticulosis of large intestine without perforation or abscess without bleeding: Secondary | ICD-10-CM | POA: Insufficient documentation

## 2013-02-21 DIAGNOSIS — R079 Chest pain, unspecified: Secondary | ICD-10-CM | POA: Insufficient documentation

## 2013-02-21 DIAGNOSIS — R188 Other ascites: Secondary | ICD-10-CM | POA: Insufficient documentation

## 2013-02-25 ENCOUNTER — Encounter: Payer: Self-pay | Admitting: Internal Medicine

## 2013-02-25 ENCOUNTER — Ambulatory Visit (HOSPITAL_BASED_OUTPATIENT_CLINIC_OR_DEPARTMENT_OTHER): Payer: Medicare Other

## 2013-02-25 ENCOUNTER — Telehealth: Payer: Self-pay | Admitting: *Deleted

## 2013-02-25 ENCOUNTER — Telehealth: Payer: Self-pay | Admitting: Internal Medicine

## 2013-02-25 ENCOUNTER — Ambulatory Visit (HOSPITAL_BASED_OUTPATIENT_CLINIC_OR_DEPARTMENT_OTHER): Payer: Medicare Other | Admitting: Internal Medicine

## 2013-02-25 ENCOUNTER — Other Ambulatory Visit (HOSPITAL_BASED_OUTPATIENT_CLINIC_OR_DEPARTMENT_OTHER): Payer: Medicare Other | Admitting: Lab

## 2013-02-25 VITALS — BP 176/74 | HR 56 | Temp 98.1°F | Resp 18 | Ht 69.0 in | Wt 180.7 lb

## 2013-02-25 DIAGNOSIS — C3491 Malignant neoplasm of unspecified part of right bronchus or lung: Secondary | ICD-10-CM

## 2013-02-25 DIAGNOSIS — C7A1 Malignant poorly differentiated neuroendocrine tumors: Secondary | ICD-10-CM

## 2013-02-25 DIAGNOSIS — C3490 Malignant neoplasm of unspecified part of unspecified bronchus or lung: Secondary | ICD-10-CM

## 2013-02-25 DIAGNOSIS — Z5111 Encounter for antineoplastic chemotherapy: Secondary | ICD-10-CM

## 2013-02-25 DIAGNOSIS — C7B8 Other secondary neuroendocrine tumors: Secondary | ICD-10-CM

## 2013-02-25 DIAGNOSIS — C787 Secondary malignant neoplasm of liver and intrahepatic bile duct: Secondary | ICD-10-CM

## 2013-02-25 LAB — COMPREHENSIVE METABOLIC PANEL (CC13)
ALT: 38 U/L (ref 0–55)
Alkaline Phosphatase: 602 U/L — ABNORMAL HIGH (ref 40–150)
BUN: 13.4 mg/dL (ref 7.0–26.0)
CO2: 23 mEq/L (ref 22–29)
Chloride: 104 mEq/L (ref 98–109)
Creatinine: 1.1 mg/dL (ref 0.7–1.3)
Glucose: 268 mg/dl — ABNORMAL HIGH (ref 70–140)
Sodium: 134 mEq/L — ABNORMAL LOW (ref 136–145)
Total Bilirubin: 1.34 mg/dL — ABNORMAL HIGH (ref 0.20–1.20)
Total Protein: 7.5 g/dL (ref 6.4–8.3)

## 2013-02-25 LAB — CBC WITH DIFFERENTIAL/PLATELET
BASO%: 0.3 % (ref 0.0–2.0)
EOS%: 0.1 % (ref 0.0–7.0)
Eosinophils Absolute: 0 10*3/uL (ref 0.0–0.5)
LYMPH%: 10.6 % — ABNORMAL LOW (ref 14.0–49.0)
MCH: 32.5 pg (ref 27.2–33.4)
MCHC: 32.4 g/dL (ref 32.0–36.0)
MCV: 100.4 fL — ABNORMAL HIGH (ref 79.3–98.0)
MONO%: 17 % — ABNORMAL HIGH (ref 0.0–14.0)
Platelets: 136 10*3/uL — ABNORMAL LOW (ref 140–400)
RBC: 2.83 10*6/uL — ABNORMAL LOW (ref 4.20–5.82)
nRBC: 0 % (ref 0–0)

## 2013-02-25 MED ORDER — PALONOSETRON HCL INJECTION 0.25 MG/5ML
0.2500 mg | Freq: Once | INTRAVENOUS | Status: AC
  Administered 2013-02-25: 0.25 mg via INTRAVENOUS

## 2013-02-25 MED ORDER — DEXAMETHASONE SODIUM PHOSPHATE 20 MG/5ML IJ SOLN
INTRAMUSCULAR | Status: AC
  Filled 2013-02-25: qty 5

## 2013-02-25 MED ORDER — SODIUM CHLORIDE 0.9 % IJ SOLN
10.0000 mL | INTRAMUSCULAR | Status: DC | PRN
  Administered 2013-02-25: 10 mL
  Filled 2013-02-25: qty 10

## 2013-02-25 MED ORDER — ATROPINE SULFATE 1 MG/ML IJ SOLN
0.5000 mg | Freq: Once | INTRAMUSCULAR | Status: AC | PRN
  Administered 2013-02-25: 0.5 mg via INTRAVENOUS

## 2013-02-25 MED ORDER — ATROPINE SULFATE 1 MG/ML IJ SOLN
INTRAMUSCULAR | Status: AC
  Filled 2013-02-25: qty 1

## 2013-02-25 MED ORDER — POTASSIUM CHLORIDE 2 MEQ/ML IV SOLN
Freq: Once | INTRAVENOUS | Status: AC
  Administered 2013-02-25: 14:00:00 via INTRAVENOUS
  Filled 2013-02-25: qty 10

## 2013-02-25 MED ORDER — HEPARIN SOD (PORK) LOCK FLUSH 100 UNIT/ML IV SOLN
500.0000 [IU] | Freq: Once | INTRAVENOUS | Status: AC | PRN
  Administered 2013-02-25: 500 [IU]
  Filled 2013-02-25: qty 5

## 2013-02-25 MED ORDER — DEXAMETHASONE SODIUM PHOSPHATE 20 MG/5ML IJ SOLN
12.0000 mg | Freq: Once | INTRAMUSCULAR | Status: AC
  Administered 2013-02-25: 12 mg via INTRAVENOUS

## 2013-02-25 MED ORDER — METHYLPREDNISOLONE (PAK) 4 MG PO TABS: ORAL_TABLET | ORAL | Status: DC

## 2013-02-25 MED ORDER — IRINOTECAN HCL CHEMO INJECTION 100 MG/5ML
65.0000 mg/m2 | Freq: Once | INTRAVENOUS | Status: AC
  Administered 2013-02-25: 130 mg via INTRAVENOUS
  Filled 2013-02-25: qty 6.5

## 2013-02-25 MED ORDER — SODIUM CHLORIDE 0.9 % IV SOLN
150.0000 mg | Freq: Once | INTRAVENOUS | Status: AC
  Administered 2013-02-25: 150 mg via INTRAVENOUS
  Filled 2013-02-25: qty 5

## 2013-02-25 MED ORDER — CISPLATIN CHEMO INJECTION 100MG/100ML
30.0000 mg/m2 | Freq: Once | INTRAVENOUS | Status: AC
  Administered 2013-02-25: 60 mg via INTRAVENOUS
  Filled 2013-02-25: qty 60

## 2013-02-25 MED ORDER — SODIUM CHLORIDE 0.9 % IV SOLN
Freq: Once | INTRAVENOUS | Status: AC
  Administered 2013-02-25: 14:00:00 via INTRAVENOUS

## 2013-02-25 MED ORDER — PALONOSETRON HCL INJECTION 0.25 MG/5ML
INTRAVENOUS | Status: AC
  Filled 2013-02-25: qty 5

## 2013-02-25 NOTE — Telephone Encounter (Signed)
Gave pt appt for lab,MD and chemo for October and November 2014 °

## 2013-02-25 NOTE — Patient Instructions (Addendum)
[facility] Discharge Instructions for Patients Receiving Chemotherapy  Today you received the following chemotherapy agents Carboplatin and Cisplatin.  To help prevent nausea and vomiting after your treatment, we encourage you to take your nausea medication as needed.   If you develop nausea and vomiting that is not controlled by your nausea medication, call the clinic.   BELOW ARE SYMPTOMS THAT SHOULD BE REPORTED IMMEDIATELY:  *FEVER GREATER THAN 100.5 F  *CHILLS WITH OR WITHOUT FEVER  NAUSEA AND VOMITING THAT IS NOT CONTROLLED WITH YOUR NAUSEA MEDICATION  *UNUSUAL SHORTNESS OF BREATH  *UNUSUAL BRUISING OR BLEEDING  TENDERNESS IN MOUTH AND THROAT WITH OR WITHOUT PRESENCE OF ULCERS  *URINARY PROBLEMS  *BOWEL PROBLEMS  UNUSUAL RASH Items with * indicate a potential emergency and should be followed up as soon as possible.  Feel free to call the clinic you have any questions or concerns. The clinic phone number is [phone].

## 2013-02-25 NOTE — Telephone Encounter (Signed)
Per staff message and POF I have scheduled appts.  JMW  

## 2013-02-25 NOTE — Patient Instructions (Signed)
CURRENT THERAPY: Systemic chemotherapy with cisplatin 30 MG/M2 and irinotecan 65 mg/M2 on days 1 and 8 every 3 weeks. First dose on 02/25/2013  CHEMOTHERAPY INTENT: Palliative  CURRENT # OF CHEMOTHERAPY CYCLES: 1  CURRENT ANTIEMETICS: Zofran, Compazine and dexamethasone  CURRENT SMOKING STATUS: Currently Nonsmoker, quit smoking 42 years ago.  ORAL CHEMOTHERAPY AND CONSENT: None  CURRENT BISPHOSPHONATES USE: None  PAIN MANAGEMENT: No pain  NARCOTICS INDUCED CONSTIPATION: No constipation  LIVING WILL AND CODE STATUS: Full code

## 2013-02-25 NOTE — Progress Notes (Signed)
[facility] [phone]   [phone]  OFFICE PROGRESS NOTE  TAPPER,DAVID B, MD [address], [place] [address]  DIAGNOSIS AND STAGE: Extensive stage small cell lung cancer with bilateral pulmonary nodules, mediastinal and supraclavicular lymphadenopathy as well as liver and bone metastases diagnosed in July of 2014   PRIOR THERAPY: Systemic chemotherapy with carboplatin for AUC of 4 on day 1 and etoposide 80 mg/M2 on days 1, 2 and 3 with Neulasta support on day 4. Status post 3 cycles. First cycle was given on 12/04/2012 at [facility]. Last dose was given 02/04/2013 discontinued today secondary to disease progression.   CURRENT THERAPY: Systemic chemotherapy with cisplatin 30 MG/M2 and irinotecan 65 mg/M2 on days 1 and 8 every 3 weeks. First dose on 02/25/2013  CHEMOTHERAPY INTENT: Palliative  CURRENT # OF CHEMOTHERAPY CYCLES: 1  CURRENT ANTIEMETICS: Zofran, Compazine and dexamethasone  CURRENT SMOKING STATUS: Currently Nonsmoker, quit smoking 42 years ago.  ORAL CHEMOTHERAPY AND CONSENT: None  CURRENT BISPHOSPHONATES USE: None  PAIN MANAGEMENT: No pain  NARCOTICS INDUCED CONSTIPATION: No constipation  LIVING WILL AND CODE STATUS: Full code   INTERVAL HISTORY: Phillip [name] 79 y.o. male returns to the clinic today for followup visit accompanied his wife. The patient tolerated the last cycle of his treatment fairly well except for the fatigue and shortness breath with exertion. He denied having any significant chest pain, cough or hemoptysis. The patient has no significant nausea or vomiting. He has no fever or chills. He lost few pounds in the last few weeks. He had repeat CT scan of the chest, abdomen and pelvis for restaging of his disease and he is here today for evaluation and discussion of his scan results.   MEDICAL HISTORY: Past Medical History  Diagnosis Date  . Hypertension   . Pulmonary fibrosis 2010  . Sleep apnea    . Diabetes mellitus without complication   . GERD (gastroesophageal reflux disease)   . Arthritis   . Anxiety   . Cancer     ALLERGIES:  has No Known Allergies.  MEDICATIONS:  Current Outpatient Prescriptions  Medication Sig Dispense Refill  . allopurinol (ZYLOPRIM) 100 MG tablet Take 200 mg by mouth daily.      Marland Kitchen aspirin 81 MG tablet Take 81 mg by mouth daily.      . chlorproMAZINE (THORAZINE) 25 MG tablet Take 25 mg by mouth 3 (three) times daily as needed (hiccups.).      Marland Kitchen colchicine 0.6 MG tablet Take 0.6 mg by mouth 2 (two) times daily as needed (gout).      . diazepam (VALIUM) 5 MG tablet Take 5 mg by mouth at bedtime as needed for sleep.      Marland Kitchen HYDROcodone-acetaminophen (NORCO) 10-325 MG per tablet Take 1 tablet by mouth every 6 (six) hours as needed for pain.  120 tablet  0  . Insulin Isophane & Regular (NOVOLIN 70/30 RELION [place]) Inject 55 Units into the skin 2 (two) times daily.      Marland Kitchen lidocaine-prilocaine (EMLA) cream Apply topically as needed.  30 g  0  . LORazepam (ATIVAN) 1 MG tablet Take 1 tablet (1 mg total) by mouth every 8 (eight) hours as needed for anxiety.  90 tablet  0  . losartan (COZAAR) 100 MG tablet Take 100 mg by mouth daily. Takes 1/2 tablet daily      . Magnesium-Zinc (MAGNESIUM-CHELATED ZINC PO) Take 1 tablet by mouth 3 (  three) times daily.      . methylPREDNISolone (MEDROL DOSEPAK) 4 MG tablet Take by mouth. follow package directions      . mirtazapine (REMERON) 30 MG tablet Take 1 tablet (30 mg total) by mouth at bedtime.  30 tablet  2  . Multiple Vitamin (MULTIVITAMIN WITH MINERALS) TABS Take 1 tablet by mouth daily.      . penicillin v potassium (VEETID) 500 MG tablet Take 500 mg by mouth 4 (four) times daily. For 7 days.      . prochlorperazine (COMPAZINE) 10 MG tablet Take 1 tablet (10 mg total) by mouth every 6 (six) hours as needed.  60 tablet  0  . bumetanide (BUMEX) 1 MG tablet Take 1 mg by mouth daily.       No current facility-administered  medications for this visit.    SURGICAL HISTORY:  Past Surgical History  Procedure Laterality Date  . Prostate surgery      Patient wife states they rimmed out prostate    REVIEW OF SYSTEMS:  Constitutional: positive for anorexia, fatigue and weight loss Eyes: negative Ears, nose, mouth, throat, and face: negative Respiratory: positive for dyspnea on exertion Cardiovascular: negative Gastrointestinal: negative Genitourinary:negative Integument/breast: negative Hematologic/lymphatic: negative Musculoskeletal:negative Neurological: negative Behavioral/Psych: negative Endocrine: negative Allergic/Immunologic: negative   PHYSICAL EXAMINATION: General appearance: alert, cooperative, fatigued and no distress Head: Normocephalic, without obvious abnormality, atraumatic Neck: no adenopathy, no JVD, supple, symmetrical, trachea midline and thyroid not enlarged, symmetric, no tenderness/mass/nodules Lymph nodes: Cervical, supraclavicular, and axillary nodes normal. Resp: clear to auscultation bilaterally Back: symmetric, no curvature. ROM normal. No CVA tenderness. Cardio: regular rate and rhythm, S1, S2 normal, no murmur, click, rub or gallop GI: soft, non-tender; bowel sounds normal; no masses,  no organomegaly Extremities: extremities normal, atraumatic, no cyanosis or edema Neurologic: Alert and oriented X 3, normal strength and tone. Normal symmetric reflexes. Normal coordination and gait  ECOG PERFORMANCE STATUS: 2 - Symptomatic, <50% confined to bed  Blood pressure 176/74, pulse 56, temperature 98.1 F (36.7 C), temperature source Oral, resp. rate 18, height 5\' 9"  (1.753 m), weight 180 lb 11.2 oz (81.965 kg), SpO2 98.00%.  LABORATORY DATA: Lab Results  Component Value Date   WBC 7.9 02/25/2013   HGB 9.2* 02/25/2013   HCT 28.4* 02/25/2013   MCV 100.4* 02/25/2013   PLT 136* 02/25/2013      Chemistry      Component Value Date/Time   NA 134* 02/25/2013 1053   NA 128*  12/07/2012 1018   K 3.6 02/25/2013 1053   K 4.8 12/07/2012 1018   CL 97 12/07/2012 1018   CO2 23 02/25/2013 1053   CO2 21 12/07/2012 1018   BUN 13.4 02/25/2013 1053   BUN 35* 12/07/2012 1018   CREATININE 1.1 02/25/2013 1053   CREATININE 0.95 12/07/2012 1018      Component Value Date/Time   CALCIUM 7.9* 02/25/2013 1053   CALCIUM 8.3* 12/07/2012 1018   ALKPHOS 602* 02/25/2013 1053   ALKPHOS 480* 12/05/2012 0428   AST 76* 02/25/2013 1053   AST 118* 12/05/2012 0428   ALT 38 02/25/2013 1053   ALT 79* 12/05/2012 0428   BILITOT 1.34* 02/25/2013 1053   BILITOT 2.2* 12/05/2012 0428       RADIOGRAPHIC STUDIES: Ct Chest Wo Contrast  02/21/2013   CLINICAL DATA:  Restaging for lung carcinoma. Patient complaining of left lower chest pain.  EXAM: CT CHEST, ABDOMEN AND PELVIS WITHOUT CONTRAST  TECHNIQUE: Multidetector CT imaging of the chest, abdomen and  pelvis was performed following the standard protocol without IV contrast.  COMPARISON:  01/11/2013.  FINDINGS:   CT CHEST FINDINGS  There is a focal, masslike opacity with mildly spiculated margins in the left upper lobe posterior laterally adjacent to the obliques fissure centered on image 17, series 3. It measures 20 mm x 13 mm in size, minimally larger than it was on the prior exam.  There is a mass in the central right upper lobe abutting the posterior upper right hilar structures. It measures 22 mm x 18 mm where it had measured 25 mm x 20 mm.  There is a small, 8 mm, pleural-based focal masslike opacity in the right lower lobe on image 38, series 3 that is stable.  No other lung masses or masslike opacities. Changes of emphysema interstitial scarring are stable. No pleural effusion.  The heart is normal in size and configuration. There are dense coronary artery calcifications. No mediastinal masses or enlarged lymph nodes are seen. There are no neck base or axillary masses or adenopathy.  The bones are demineralized. There are degenerative changes throughout the  visualized spine. No osteoblastic or osteolytic lesions are seen.    CT ABDOMEN AND PELVIS FINDINGS  The liver has a nodular contour, demonstrate central volume loss and diffuse heterogeneous attenuation. A vague focal hypoattenuating lesion suggested in the inferior right lower lobe measuring 17 mm. Other less well-defined areas of relative hypoattenuation are also evident. This change from prior study suggest worsening metastatic disease with lesions that are now more extensive but less well-defined.  The spleen is enlarged measuring 19 cm from superior to inferior, which is mildly larger than it was previously. No splenic mass or focal lesion is seen.  There are numerous gallstones. No acute cholecystitis. No bile duct dilation. Unremarkable pancreas. There are no adrenal masses. Tiny intrarenal stone is suggested on the right. No renal masses. No hydronephrosis. Normal ureters and bladder. Mild adenopathy along the gastrohepatic ligament with the largest node being a posterior gastrohepatic ligament node measuring 14 mm in short axis. These are stable. No other adenopathy.  A small amount of ascites lies adjacent to the liver and spleen and collects in the posterior pelvic recess.  There is significant colonic diverticulosis most evident along the sigmoid colon. No diverticulitis. The bowel is otherwise unremarkable. A normal appendix is visualized.  No osteoblastic or osteolytic lesions. Chronic bilateral pars defects are noted at L5-S1. The bones are diffusely demineralized and there are degenerative changes noted throughout the spine.  There is diffuse ectasia of the abdominal aorta and iliac arteries with calcified atherosclerotic plaque.    IMPRESSION: CT CHEST IMPRESSION  1. Slight improvement from the prior study. The mass posterior to the upper right hilum, within the right upper lobe, is slightly smaller, measuring 22 mm x 18 mm where it had measured 25 mm x 20 mm. 2. An irregular focal masslike  opacity in the left upper lobe may be minimally larger than on the prior study, although this minimal difference may be technical. 3. Small pleural based right lower lobe nodular opacity is stable. No new lung nodules. 4. Stable interstitial fibrosis and emphysema.  CT ABDOMEN AND PELVIS IMPRESSION  1. Low-density liver lesions seen previously are no longer well defined on this unenhanced study. There are now more geographic areas of hypoattenuation leading to in overall mottled appearance of the liver. This suggests worsening metastatic disease with larger and less well-defined metastatic lesions. The morphology of the liver also suggests cirrhosis.  2. Splenomegaly, which has mildly increased from the prior study, suggesting portal venous hypertension. 3. Mild gastrohepatic ligament adenopathy, unchanged. 4. No other evidence of metastatic disease below the diaphragm. 5. Small amount of ascites, which has developed since the prior study. This may also be due to portal venous hypertension.   Electronically Signed   By: Amie Portland   On: 02/21/2013 11:21    ASSESSMENT AND PLAN: This is a very pleasant 79 years old white male with extensive stage small cell lung cancer status post 4 cycles of systemic chemotherapy with carboplatin and etoposide but unfortunately his recent scan showed evidence for disease progression especially in the liver. I discussed the scan results and showed the images to the patient and his wife. I recommended for the patient to discontinue his treatment was carboplatin and etoposide at this point.   I discussed with the patient other treatment options including palliative care versus systemic chemotherapy with second line treatment in the form of cisplatin 30 mg/M2 and irinotecan 65 mg/M2 on days 1 and 8 every 3 weeks.   I discussed with the patient adverse effect of this treatment including but not limited to alopecia, myelosuppression, nausea and vomiting, peripheral neuropathy,  liver or renal dysfunction as well as hearing deficit. The patient would like to proceed with treatment as planned and he is expected to start the first cycle of this treatment today. He would come back for followup visit in 3 weeks with the next cycle of his chemotherapy.   The patient was advised to call immediately if he has any concerning symptoms in the interval  The patient voices understanding of current disease status and treatment options and is in agreement with the current care plan.  All questions were answered. The patient knows to call the clinic with any problems, questions or concerns. We can certainly see the patient much sooner if necessary.  I spent 15 minutes counseling the patient face to face. The total time spent in the appointment was 25 minutes.

## 2013-02-25 NOTE — Progress Notes (Signed)
Educational information printed and discussed with patient on Cisplatin and Camptosar.

## 2013-02-26 ENCOUNTER — Ambulatory Visit: Payer: Medicare Other

## 2013-02-27 ENCOUNTER — Ambulatory Visit: Payer: Medicare Other

## 2013-02-28 ENCOUNTER — Telehealth: Payer: Self-pay | Admitting: *Deleted

## 2013-02-28 NOTE — Telephone Encounter (Signed)
Pt's wife called stating that blood sugars have been creeping up to the 300-400's the past few days since his tx.  He is taking a medrol dose pack this week and also gets decadron with his chemo.  Advised they contact their PCP to possibly do an adjustment on his insulin.  SLJ

## 2013-03-04 ENCOUNTER — Other Ambulatory Visit (HOSPITAL_BASED_OUTPATIENT_CLINIC_OR_DEPARTMENT_OTHER): Payer: Medicare Other | Admitting: Lab

## 2013-03-04 ENCOUNTER — Ambulatory Visit (HOSPITAL_BASED_OUTPATIENT_CLINIC_OR_DEPARTMENT_OTHER): Payer: Medicare Other

## 2013-03-04 VITALS — BP 126/69 | HR 76 | Temp 98.2°F

## 2013-03-04 DIAGNOSIS — Z5111 Encounter for antineoplastic chemotherapy: Secondary | ICD-10-CM

## 2013-03-04 DIAGNOSIS — C3491 Malignant neoplasm of unspecified part of right bronchus or lung: Secondary | ICD-10-CM

## 2013-03-04 DIAGNOSIS — C7A1 Malignant poorly differentiated neuroendocrine tumors: Secondary | ICD-10-CM

## 2013-03-04 DIAGNOSIS — C787 Secondary malignant neoplasm of liver and intrahepatic bile duct: Secondary | ICD-10-CM

## 2013-03-04 DIAGNOSIS — C7B8 Other secondary neuroendocrine tumors: Secondary | ICD-10-CM

## 2013-03-04 LAB — CBC WITH DIFFERENTIAL/PLATELET
BASO%: 0.9 % (ref 0.0–2.0)
MCHC: 33.6 g/dL (ref 32.0–36.0)
MONO#: 0.5 10*3/uL (ref 0.1–0.9)
RBC: 2.98 10*6/uL — ABNORMAL LOW (ref 4.20–5.82)
WBC: 5.5 10*3/uL (ref 4.0–10.3)
lymph#: 0.9 10*3/uL (ref 0.9–3.3)

## 2013-03-04 LAB — COMPREHENSIVE METABOLIC PANEL (CC13)
ALT: 68 U/L — ABNORMAL HIGH (ref 0–55)
AST: 128 U/L — ABNORMAL HIGH (ref 5–34)
Albumin: 2.2 g/dL — ABNORMAL LOW (ref 3.5–5.0)
Anion Gap: 9 mEq/L (ref 3–11)
CO2: 25 mEq/L (ref 22–29)
Calcium: 8.5 mg/dL (ref 8.4–10.4)
Chloride: 105 mEq/L (ref 98–109)
Sodium: 139 mEq/L (ref 136–145)
Total Bilirubin: 1.54 mg/dL — ABNORMAL HIGH (ref 0.20–1.20)
Total Protein: 7.5 g/dL (ref 6.4–8.3)

## 2013-03-04 LAB — MAGNESIUM (CC13): Magnesium: 1.7 mg/dl (ref 1.5–2.5)

## 2013-03-04 MED ORDER — DEXAMETHASONE SODIUM PHOSPHATE 20 MG/5ML IJ SOLN
INTRAMUSCULAR | Status: AC
  Filled 2013-03-04: qty 5

## 2013-03-04 MED ORDER — PALONOSETRON HCL INJECTION 0.25 MG/5ML
0.2500 mg | Freq: Once | INTRAVENOUS | Status: AC
  Administered 2013-03-04: 0.25 mg via INTRAVENOUS

## 2013-03-04 MED ORDER — ATROPINE SULFATE 1 MG/ML IJ SOLN
INTRAMUSCULAR | Status: AC
  Filled 2013-03-04: qty 1

## 2013-03-04 MED ORDER — POTASSIUM CHLORIDE 2 MEQ/ML IV SOLN
Freq: Once | INTRAVENOUS | Status: AC
  Administered 2013-03-04: 09:00:00 via INTRAVENOUS
  Filled 2013-03-04: qty 10

## 2013-03-04 MED ORDER — SODIUM CHLORIDE 0.9 % IV SOLN
30.0000 mg/m2 | Freq: Once | INTRAVENOUS | Status: AC
  Administered 2013-03-04: 60 mg via INTRAVENOUS
  Filled 2013-03-04: qty 60

## 2013-03-04 MED ORDER — SODIUM CHLORIDE 0.9 % IV SOLN
Freq: Once | INTRAVENOUS | Status: AC
  Administered 2013-03-04: 09:00:00 via INTRAVENOUS

## 2013-03-04 MED ORDER — PALONOSETRON HCL INJECTION 0.25 MG/5ML
INTRAVENOUS | Status: AC
  Filled 2013-03-04: qty 5

## 2013-03-04 MED ORDER — SODIUM CHLORIDE 0.9 % IJ SOLN
10.0000 mL | INTRAMUSCULAR | Status: DC | PRN
  Administered 2013-03-04: 10 mL
  Filled 2013-03-04: qty 10

## 2013-03-04 MED ORDER — SODIUM CHLORIDE 0.9 % IV SOLN
150.0000 mg | Freq: Once | INTRAVENOUS | Status: AC
  Administered 2013-03-04: 150 mg via INTRAVENOUS
  Filled 2013-03-04: qty 5

## 2013-03-04 MED ORDER — IRINOTECAN HCL CHEMO INJECTION 100 MG/5ML
65.0000 mg/m2 | Freq: Once | INTRAVENOUS | Status: AC
  Administered 2013-03-04: 130 mg via INTRAVENOUS
  Filled 2013-03-04: qty 6.5

## 2013-03-04 MED ORDER — ATROPINE SULFATE 1 MG/ML IJ SOLN
0.5000 mg | Freq: Once | INTRAMUSCULAR | Status: AC | PRN
  Administered 2013-03-04: 0.5 mg via INTRAVENOUS

## 2013-03-04 MED ORDER — HEPARIN SOD (PORK) LOCK FLUSH 100 UNIT/ML IV SOLN
500.0000 [IU] | Freq: Once | INTRAVENOUS | Status: AC | PRN
  Administered 2013-03-04: 500 [IU]
  Filled 2013-03-04: qty 5

## 2013-03-04 MED ORDER — DEXAMETHASONE SODIUM PHOSPHATE 20 MG/5ML IJ SOLN
12.0000 mg | Freq: Once | INTRAMUSCULAR | Status: AC
  Administered 2013-03-04: 12 mg via INTRAVENOUS

## 2013-03-04 NOTE — Progress Notes (Signed)
Patient unable to urinate after 600 mL Cisplatin hydration fluids. Order given and carried out for 250 mL of normal saline per Dr. Arbutus Ped. No urine output, Dr. Arbutus Ped notified. Ok to treat without any urine output per Dr. Arbutus Ped.  Patient voided 100 ml of dark urine. Order given to in and out catheterize patient after cisplatin infusion.

## 2013-03-04 NOTE — Patient Instructions (Addendum)
[facility] Discharge Instructions for Patients Receiving Chemotherapy  Today you received the following chemotherapy agents Irinotecan/Cisplatin To help prevent nausea and vomiting after your treatment, we encourage you to take your nausea medication as prescribed.  If you develop nausea and vomiting that is not controlled by your nausea medication, call the clinic.   BELOW ARE SYMPTOMS THAT SHOULD BE REPORTED IMMEDIATELY:  *FEVER GREATER THAN 100.5 F  *CHILLS WITH OR WITHOUT FEVER  NAUSEA AND VOMITING THAT IS NOT CONTROLLED WITH YOUR NAUSEA MEDICATION  *UNUSUAL SHORTNESS OF BREATH  *UNUSUAL BRUISING OR BLEEDING  TENDERNESS IN MOUTH AND THROAT WITH OR WITHOUT PRESENCE OF ULCERS  *URINARY PROBLEMS  *BOWEL PROBLEMS  UNUSUAL RASH Items with * indicate a potential emergency and should be followed up as soon as possible.  Feel free to call the clinic you have any questions or concerns. The clinic phone number is [phone].    

## 2013-03-11 ENCOUNTER — Emergency Department (HOSPITAL_COMMUNITY): Payer: Medicare Other

## 2013-03-11 ENCOUNTER — Inpatient Hospital Stay (HOSPITAL_COMMUNITY): Payer: Medicare Other

## 2013-03-11 ENCOUNTER — Encounter (HOSPITAL_COMMUNITY): Payer: Self-pay | Admitting: Emergency Medicine

## 2013-03-11 ENCOUNTER — Inpatient Hospital Stay (HOSPITAL_COMMUNITY)
Admission: EM | Admit: 2013-03-11 | Discharge: 2013-03-13 | DRG: 871 | Disposition: A | Discharge status: Hospice | Payer: Medicare Other | Attending: Critical Care Medicine | Admitting: Critical Care Medicine

## 2013-03-11 DIAGNOSIS — C7889 Secondary malignant neoplasm of other digestive organs: Secondary | ICD-10-CM | POA: Diagnosis present

## 2013-03-11 DIAGNOSIS — IMO0002 Reserved for concepts with insufficient information to code with codable children: Secondary | ICD-10-CM

## 2013-03-11 DIAGNOSIS — E119 Type 2 diabetes mellitus without complications: Secondary | ICD-10-CM | POA: Diagnosis present

## 2013-03-11 DIAGNOSIS — Z9981 Dependence on supplemental oxygen: Secondary | ICD-10-CM

## 2013-03-11 DIAGNOSIS — J930 Spontaneous tension pneumothorax: Secondary | ICD-10-CM | POA: Diagnosis present

## 2013-03-11 DIAGNOSIS — Z515 Encounter for palliative care: Secondary | ICD-10-CM

## 2013-03-11 DIAGNOSIS — N289 Disorder of kidney and ureter, unspecified: Secondary | ICD-10-CM | POA: Diagnosis present

## 2013-03-11 DIAGNOSIS — K219 Gastro-esophageal reflux disease without esophagitis: Secondary | ICD-10-CM | POA: Diagnosis present

## 2013-03-11 DIAGNOSIS — J9600 Acute respiratory failure, unspecified whether with hypoxia or hypercapnia: Secondary | ICD-10-CM

## 2013-03-11 DIAGNOSIS — G4730 Sleep apnea, unspecified: Secondary | ICD-10-CM | POA: Diagnosis present

## 2013-03-11 DIAGNOSIS — Z87891 Personal history of nicotine dependence: Secondary | ICD-10-CM

## 2013-03-11 DIAGNOSIS — T451X5A Adverse effect of antineoplastic and immunosuppressive drugs, initial encounter: Secondary | ICD-10-CM | POA: Diagnosis present

## 2013-03-11 DIAGNOSIS — R599 Enlarged lymph nodes, unspecified: Secondary | ICD-10-CM | POA: Diagnosis present

## 2013-03-11 DIAGNOSIS — R918 Other nonspecific abnormal finding of lung field: Secondary | ICD-10-CM | POA: Diagnosis present

## 2013-03-11 DIAGNOSIS — J9610 Chronic respiratory failure, unspecified whether with hypoxia or hypercapnia: Secondary | ICD-10-CM

## 2013-03-11 DIAGNOSIS — A419 Sepsis, unspecified organism: Principal | ICD-10-CM | POA: Diagnosis present

## 2013-03-11 DIAGNOSIS — E871 Hypo-osmolality and hyponatremia: Secondary | ICD-10-CM

## 2013-03-11 DIAGNOSIS — E43 Unspecified severe protein-calorie malnutrition: Secondary | ICD-10-CM | POA: Diagnosis present

## 2013-03-11 DIAGNOSIS — R079 Chest pain, unspecified: Secondary | ICD-10-CM

## 2013-03-11 DIAGNOSIS — W19XXXA Unspecified fall, initial encounter: Secondary | ICD-10-CM | POA: Diagnosis present

## 2013-03-11 DIAGNOSIS — C3490 Malignant neoplasm of unspecified part of unspecified bronchus or lung: Secondary | ICD-10-CM | POA: Diagnosis present

## 2013-03-11 DIAGNOSIS — J8410 Pulmonary fibrosis, unspecified: Secondary | ICD-10-CM

## 2013-03-11 DIAGNOSIS — Z66 Do not resuscitate: Secondary | ICD-10-CM | POA: Diagnosis present

## 2013-03-11 DIAGNOSIS — J939 Pneumothorax, unspecified: Secondary | ICD-10-CM | POA: Diagnosis present

## 2013-03-11 DIAGNOSIS — I10 Essential (primary) hypertension: Secondary | ICD-10-CM | POA: Diagnosis present

## 2013-03-11 DIAGNOSIS — R05 Cough: Secondary | ICD-10-CM | POA: Diagnosis present

## 2013-03-11 DIAGNOSIS — Z9181 History of falling: Secondary | ICD-10-CM

## 2013-03-11 DIAGNOSIS — J9383 Other pneumothorax: Secondary | ICD-10-CM

## 2013-03-11 DIAGNOSIS — J9601 Acute respiratory failure with hypoxia: Secondary | ICD-10-CM | POA: Diagnosis present

## 2013-03-11 DIAGNOSIS — R748 Abnormal levels of other serum enzymes: Secondary | ICD-10-CM

## 2013-03-11 DIAGNOSIS — D61818 Other pancytopenia: Secondary | ICD-10-CM

## 2013-03-11 DIAGNOSIS — K5289 Other specified noninfective gastroenteritis and colitis: Secondary | ICD-10-CM | POA: Diagnosis present

## 2013-03-11 DIAGNOSIS — C787 Secondary malignant neoplasm of liver and intrahepatic bile duct: Secondary | ICD-10-CM | POA: Diagnosis present

## 2013-03-11 DIAGNOSIS — D61810 Antineoplastic chemotherapy induced pancytopenia: Secondary | ICD-10-CM

## 2013-03-11 DIAGNOSIS — R6520 Severe sepsis without septic shock: Secondary | ICD-10-CM | POA: Diagnosis present

## 2013-03-11 DIAGNOSIS — R64 Cachexia: Secondary | ICD-10-CM | POA: Diagnosis present

## 2013-03-11 DIAGNOSIS — R0602 Shortness of breath: Secondary | ICD-10-CM | POA: Diagnosis present

## 2013-03-11 DIAGNOSIS — K5792 Diverticulitis of intestine, part unspecified, without perforation or abscess without bleeding: Secondary | ICD-10-CM

## 2013-03-11 DIAGNOSIS — N259 Disorder resulting from impaired renal tubular function, unspecified: Secondary | ICD-10-CM | POA: Diagnosis present

## 2013-03-11 DIAGNOSIS — C7951 Secondary malignant neoplasm of bone: Secondary | ICD-10-CM | POA: Diagnosis present

## 2013-03-11 DIAGNOSIS — I4891 Unspecified atrial fibrillation: Secondary | ICD-10-CM | POA: Diagnosis present

## 2013-03-11 DIAGNOSIS — K7460 Unspecified cirrhosis of liver: Secondary | ICD-10-CM | POA: Diagnosis present

## 2013-03-11 DIAGNOSIS — K5732 Diverticulitis of large intestine without perforation or abscess without bleeding: Secondary | ICD-10-CM | POA: Diagnosis present

## 2013-03-11 LAB — COMPREHENSIVE METABOLIC PANEL
ALT: 27 U/L (ref 0–53)
AST: 45 U/L — ABNORMAL HIGH (ref 0–37)
Albumin: 2.2 g/dL — ABNORMAL LOW (ref 3.5–5.2)
Alkaline Phosphatase: 478 U/L — ABNORMAL HIGH (ref 39–117)
CO2: 19 mEq/L (ref 19–32)
Chloride: 94 mEq/L — ABNORMAL LOW (ref 96–112)
Creatinine, Ser: 2.75 mg/dL — ABNORMAL HIGH (ref 0.50–1.35)
GFR calc non Af Amer: 21 mL/min — ABNORMAL LOW (ref >90)
Potassium: 4.3 mEq/L (ref 3.5–5.1)
Total Bilirubin: 2.6 mg/dL — ABNORMAL HIGH (ref 0.3–1.2)

## 2013-03-11 LAB — GLUCOSE, CAPILLARY
Glucose-Capillary: 161 mg/dL — ABNORMAL HIGH (ref 70–99)
Glucose-Capillary: 156 mg/dL — ABNORMAL HIGH (ref 70–99)

## 2013-03-11 LAB — CBC WITH DIFFERENTIAL/PLATELET
Basophils Absolute: 0 10*3/uL (ref 0.0–0.1)
Basophils Relative: 0 % (ref 0–1)
Eosinophils Absolute: 0 10*3/uL (ref 0.0–0.7)
Hemoglobin: 8.2 g/dL — ABNORMAL LOW (ref 13.0–17.0)
Lymphocytes Relative: 39 % (ref 12–46)
Lymphs Abs: 0.2 10*3/uL — ABNORMAL LOW (ref 0.7–4.0)
MCH: 33.7 pg (ref 26.0–34.0)
MCHC: 33.9 g/dL (ref 30.0–36.0)
Monocytes Absolute: 0 10*3/uL — ABNORMAL LOW (ref 0.1–1.0)
Neutrophils Relative %: 51 % (ref 43–77)
Platelets: 53 10*3/uL — ABNORMAL LOW (ref 150–400)
RDW: 21.8 % — ABNORMAL HIGH (ref 11.5–15.5)
WBC Morphology: INCREASED

## 2013-03-11 LAB — URINALYSIS, ROUTINE W REFLEX MICROSCOPIC
Glucose, UA: 100 mg/dL — AB
Ketones, ur: NEGATIVE mg/dL
Leukocytes, UA: NEGATIVE
Nitrite: NEGATIVE
Specific Gravity, Urine: 1.032 — ABNORMAL HIGH (ref 1.005–1.030)
pH: 5 (ref 5.0–8.0)

## 2013-03-11 LAB — HEMOGLOBIN A1C
Hgb A1c MFr Bld: 6.5 % — ABNORMAL HIGH (ref <5.7)
Mean Plasma Glucose: 140 mg/dL — ABNORMAL HIGH (ref <117)

## 2013-03-11 LAB — URINE MICROSCOPIC-ADD ON

## 2013-03-11 LAB — STREP PNEUMONIAE URINARY ANTIGEN: Strep Pneumo Urinary Antigen: NEGATIVE

## 2013-03-11 LAB — ACETAMINOPHEN LEVEL: Acetaminophen (Tylenol), Serum: <15 ug/mL (ref 10–30)

## 2013-03-11 LAB — PROTIME-INR: Prothrombin Time: 18.4 seconds — ABNORMAL HIGH (ref 11.6–15.2)

## 2013-03-11 LAB — PROCALCITONIN: Procalcitonin: 5.64 ng/mL

## 2013-03-11 LAB — CLOSTRIDIUM DIFFICILE BY PCR: Toxigenic C. Difficile by PCR: NEGATIVE

## 2013-03-11 LAB — MRSA PCR SCREENING: MRSA by PCR: POSITIVE — AB

## 2013-03-11 MED ORDER — CHLORHEXIDINE GLUCONATE CLOTH 2 % EX PADS
6.0000 | MEDICATED_PAD | Freq: Every day | CUTANEOUS | Status: DC
  Administered 2013-03-11 – 2013-03-13 (×3): 6 via TOPICAL

## 2013-03-11 MED ORDER — PROCHLORPERAZINE EDISYLATE 5 MG/ML IJ SOLN
10.0000 mg | Freq: Four times a day (QID) | INTRAMUSCULAR | Status: DC | PRN
  Administered 2013-03-11 – 2013-03-12 (×4): 10 mg via INTRAVENOUS
  Filled 2013-03-11 (×5): qty 2

## 2013-03-11 MED ORDER — ALBUTEROL SULFATE (5 MG/ML) 0.5% IN NEBU
2.5000 mg | INHALATION_SOLUTION | Freq: Four times a day (QID) | RESPIRATORY_TRACT | Status: DC
  Administered 2013-03-11 – 2013-03-13 (×8): 2.5 mg via RESPIRATORY_TRACT
  Filled 2013-03-11 (×8): qty 0.5

## 2013-03-11 MED ORDER — SODIUM CHLORIDE 0.9 % IV BOLUS (SEPSIS)
1000.0000 mL | Freq: Once | INTRAVENOUS | Status: AC
  Administered 2013-03-11: 1000 mL via INTRAVENOUS

## 2013-03-11 MED ORDER — HYDROCORTISONE SOD SUCCINATE 100 MG IJ SOLR
50.0000 mg | Freq: Four times a day (QID) | INTRAMUSCULAR | Status: DC
  Administered 2013-03-11 – 2013-03-13 (×9): 50 mg via INTRAVENOUS
  Filled 2013-03-11 (×2): qty 1
  Filled 2013-03-11: qty 2
  Filled 2013-03-11: qty 1
  Filled 2013-03-11: qty 2
  Filled 2013-03-11 (×4): qty 1
  Filled 2013-03-11: qty 2
  Filled 2013-03-11 (×6): qty 1

## 2013-03-11 MED ORDER — IPRATROPIUM BROMIDE 0.02 % IN SOLN
0.5000 mg | Freq: Four times a day (QID) | RESPIRATORY_TRACT | Status: DC
  Administered 2013-03-11 – 2013-03-13 (×8): 0.5 mg via RESPIRATORY_TRACT
  Filled 2013-03-11 (×8): qty 2.5

## 2013-03-11 MED ORDER — SODIUM CHLORIDE 0.9 % IV BOLUS (SEPSIS)
500.0000 mL | Freq: Once | INTRAVENOUS | Status: AC
  Administered 2013-03-11: 500 mL via INTRAVENOUS

## 2013-03-11 MED ORDER — SODIUM CHLORIDE 0.9 % IV SOLN
INTRAVENOUS | Status: DC
  Administered 2013-03-11 – 2013-03-12 (×2): via INTRAVENOUS

## 2013-03-11 MED ORDER — FENTANYL CITRATE 0.05 MG/ML IJ SOLN
50.0000 ug | Freq: Once | INTRAMUSCULAR | Status: AC
  Administered 2013-03-11: 50 ug via INTRAVENOUS

## 2013-03-11 MED ORDER — FENTANYL CITRATE 0.05 MG/ML IJ SOLN
INTRAMUSCULAR | Status: AC
  Administered 2013-03-11: 50 ug
  Filled 2013-03-11: qty 2

## 2013-03-11 MED ORDER — SODIUM CHLORIDE 0.9 % IV BOLUS (SEPSIS): 1000.0000 mL | INTRAVENOUS | Status: DC | PRN

## 2013-03-11 MED ORDER — FENTANYL CITRATE 0.05 MG/ML IJ SOLN
INTRAMUSCULAR | Status: AC
  Filled 2013-03-11: qty 2

## 2013-03-11 MED ORDER — FENTANYL CITRATE 0.05 MG/ML IJ SOLN
50.0000 ug | Freq: Once | INTRAMUSCULAR | Status: AC
  Administered 2013-03-11: 50 ug via INTRAVENOUS
  Filled 2013-03-11: qty 2

## 2013-03-11 MED ORDER — SODIUM CHLORIDE 0.9 % IV SOLN
500.0000 mg | Freq: Once | INTRAVENOUS | Status: AC
  Administered 2013-03-11: 500 mg via INTRAVENOUS
  Filled 2013-03-11: qty 500

## 2013-03-11 MED ORDER — PANTOPRAZOLE SODIUM 40 MG IV SOLR
40.0000 mg | Freq: Every day | INTRAVENOUS | Status: DC
  Administered 2013-03-11 – 2013-03-12 (×2): 40 mg via INTRAVENOUS
  Filled 2013-03-11 (×3): qty 40

## 2013-03-11 MED ORDER — VANCOMYCIN HCL IN DEXTROSE 1-5 GM/200ML-% IV SOLN
1000.0000 mg | INTRAVENOUS | Status: DC
  Administered 2013-03-12: 1000 mg via INTRAVENOUS
  Filled 2013-03-11 (×3): qty 200

## 2013-03-11 MED ORDER — FENTANYL CITRATE 0.05 MG/ML IJ SOLN
25.0000 ug | INTRAMUSCULAR | Status: DC | PRN
Start: 2013-03-11 — End: 2013-03-11
  Administered 2013-03-11 (×2): 50 ug via INTRAVENOUS
  Filled 2013-03-11: qty 2

## 2013-03-11 MED ORDER — LIDOCAINE-EPINEPHRINE 2 %-1:100000 IJ SOLN
INTRAMUSCULAR | Status: AC
  Administered 2013-03-11: 07:00:00
  Filled 2013-03-11: qty 1

## 2013-03-11 MED ORDER — MUPIROCIN 2 % EX OINT
1.0000 "application " | TOPICAL_OINTMENT | Freq: Two times a day (BID) | CUTANEOUS | Status: DC
  Administered 2013-03-11 – 2013-03-13 (×5): 1 via NASAL
  Filled 2013-03-11: qty 22

## 2013-03-11 MED ORDER — IOHEXOL 300 MG/ML  SOLN
50.0000 mL | Freq: Once | INTRAMUSCULAR | Status: AC | PRN
  Administered 2013-03-11: 50 mL via ORAL

## 2013-03-11 MED ORDER — FENTANYL CITRATE 0.05 MG/ML IJ SOLN
50.0000 ug | INTRAMUSCULAR | Status: DC | PRN
  Administered 2013-03-11 – 2013-03-13 (×20): 100 ug via INTRAVENOUS
  Filled 2013-03-11 (×20): qty 2

## 2013-03-11 MED ORDER — PIPERACILLIN-TAZOBACTAM 3.375 G IVPB 30 MIN
3.3750 g | Freq: Once | INTRAVENOUS | Status: AC
  Administered 2013-03-11: 3.375 g via INTRAVENOUS
  Filled 2013-03-11: qty 50

## 2013-03-11 MED ORDER — PIPERACILLIN-TAZOBACTAM 3.375 G IVPB
3.3750 g | Freq: Three times a day (TID) | INTRAVENOUS | Status: DC
  Administered 2013-03-11 – 2013-03-13 (×6): 3.375 g via INTRAVENOUS
  Filled 2013-03-11 (×9): qty 50

## 2013-03-11 MED ORDER — VANCOMYCIN HCL IN DEXTROSE 1-5 GM/200ML-% IV SOLN
1000.0000 mg | Freq: Once | INTRAVENOUS | Status: AC
  Administered 2013-03-11: 1000 mg via INTRAVENOUS
  Filled 2013-03-11: qty 200

## 2013-03-11 MED ORDER — INSULIN ASPART 100 UNIT/ML ~~LOC~~ SOLN
0.0000 [IU] | SUBCUTANEOUS | Status: DC
  Administered 2013-03-11 (×2): 2 [IU] via SUBCUTANEOUS
  Administered 2013-03-11: 11:00:00 via SUBCUTANEOUS
  Administered 2013-03-12: 2 [IU] via SUBCUTANEOUS
  Administered 2013-03-12: 1 [IU] via SUBCUTANEOUS
  Administered 2013-03-12: 2 [IU] via SUBCUTANEOUS
  Administered 2013-03-12: 1 [IU] via SUBCUTANEOUS
  Administered 2013-03-12 – 2013-03-13 (×3): 2 [IU] via SUBCUTANEOUS

## 2013-03-11 NOTE — ED Notes (Signed)
Patient transported to CT 

## 2013-03-11 NOTE — Procedures (Signed)
Chest Tube [facility] CATH)  Insertion Procedure Note  Indications:  Clinically significant Pneumothorax/ CT dislodged   Pre-operative Diagnosis: Pneumothorax  Post-operative Diagnosis: Pneumothorax  Procedure Details  Informed consent was obtained for the procedure, including sedation.  Risks of lung perforation, hemorrhage, arrhythmia, and adverse drug reaction were discussed.   After sterile skin prep, using standard technique, a wayne catheter tube was placed in the left lateral 7 rib space.  Findings: + air rush, + air leak in sahara, good fluctuation noted in tube.   Estimated Blood Loss:  Minimal         Specimens:  None              Complications:  None; patient tolerated the procedure well.         Disposition: ICU          Condition: guarded   Attending Attestation: I was present and scrubbed for the entire procedure. Dorcas Carrow Beeper  [phone]  Cell  [phone]  If no response or cell goes to voicemail, call beeper [phone]

## 2013-03-11 NOTE — Progress Notes (Signed)
Spoke with daughter ,Chip Boer about patient while wife was at the bedside.  Daughter said that her goal for her father was that he would be as comfortable as possible with no CPR, defibrillation etc.  Wife was in agreement with Chip Boer.  Patient currently resting quietly, but has intermittent periods of agitation and pain in spite of PRN fentanyl ordered.  Will notify elink and monitor.

## 2013-03-11 NOTE — Progress Notes (Signed)
CARE MANAGEMENT NOTE 03/11/2013  Patient:  Phillip [name], Phillip [name]   Account Number:  [id number]  Date Initiated:  03/11/2013  Documentation initiated by:  DAVIS,RHONDA  Subjective/Objective Assessment:   pt with frequent falls resp difficulty, presented with tension pnuemothorax,requiring c.t. insertion     Action/Plan:   home when stable   Anticipated DC Date:  2013/03/15   Anticipated DC Plan:  HOME/SELF CARE  In-house referral  NA      DC Planning Services  NA      PAC Choice  NA   Choice offered to / List presented to:  NA   DME arranged  NA      DME agency  NA     HH arranged  NA      HH agency  NA   Status of service:  In process, will continue to follow Medicare Important Message given?  NA - LOS <3 / Initial given by admissions (If response is "NO", the following Medicare IM given date fields will be blank) Date Medicare IM given:   Date Additional Medicare IM given:    Discharge Disposition:    Per UR Regulation:  Reviewed for med. necessity/level of care/duration of stay  If discussed at Long Length of Stay Meetings, dates discussed:    Comments:  [id number]/Rhonda Stark Jock, BSN, [place] [phone] Chart Reviewed for discharge and hospital needs. Discharge needs at time of review:  None Review of patient progress due on [id number].

## 2013-03-11 NOTE — H&P (Signed)
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 Further he had a Lt pnx that went from  small  To a tension Pnx and required a urgent left chest tube per EDP.  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 Further he had a Lt pnx that went from  small  to a tension Pnx and required a urgent left chest tube per EDP. PCCM asked to admit. His chemo was recently changed to ,EMEND, ALOXI,Decadron, last chemo was 10-13. He has proved refractory to chemo. He was followed by Dr. Sherene Sires and carries dx of O2 dependent PF. He is a full code per wife's request. PAST MEDICAL HISTORY :  Past Medical History  Diagnosis Date  . Hypertension   . Pulmonary fibrosis 2010  . Sleep apnea   . Diabetes mellitus without complication   . GERD (gastroesophageal reflux disease)   . Arthritis   . Anxiety   . Cancer    Past Surgical History  Procedure Laterality Date  . Prostate surgery      Patient wife states they rimmed out prostate   Prior to Admission medications   Medication Sig Start Date End Date Taking? Authorizing Provider  allopurinol (ZYLOPRIM) 100 MG tablet Take 200 mg by mouth every morning.    Yes Historical Provider, MD  aspirin 81 MG tablet Take 81 mg by mouth every evening.    Yes  Historical Provider, MD  chlorproMAZINE (THORAZINE) 25 MG tablet Take 25 mg by mouth 3 (three) times daily as needed (hiccups.).   Yes Historical Provider, MD  HUMALOG KWIKPEN 100 UNIT/ML SOPN Inject 7 Units into the skin daily as needed (high blood sugar). Sliding scale 02/27/13  Yes Historical Provider, MD  HYDROcodone-acetaminophen (NORCO) 10-325 MG per tablet Take 1 tablet by mouth every 6 (six) hours as needed for pain. 12/08/12  Yes Dorothea Ogle, MD  ibuprofen (ADVIL,MOTRIN) 200 MG tablet Take 600 mg by mouth every 8 (eight) hours as needed for pain.   Yes Historical Provider, MD  insulin NPH-regular (NOVOLIN 70/30) (70-30) 100 UNIT/ML injection Inject 25-35 Units into the skin 2 (two) times daily. Inject 35 units in the morning and 25 units in the evening   Yes Historical Provider, MD  lidocaine-prilocaine (EMLA) cream Apply 1 application topically as needed (port access). 11/22/12  Yes Si Gaul, MD  LORazepam (ATIVAN) 1 MG tablet Take 1 tablet (1 mg total) by mouth every 8 (eight) hours as needed for anxiety. 12/08/12  Yes Dorothea Ogle, MD  losartan (COZAAR) 100 MG tablet Take 50 mg by mouth every morning. Takes 1/2 tablet daily   Yes Historical Provider, MD  Magnesium-Zinc (MAGNESIUM-CHELATED ZINC PO) Take 1 tablet by mouth 3 (three) times daily.   Yes Historical Provider, MD  Multiple Vitamin (MULTIVITAMIN  WITH MINERALS) TABS Take 1 tablet by mouth every evening.    Yes Historical Provider, MD  prochlorperazine (COMPAZINE) 10 MG tablet Take 10 mg by mouth every 6 (six) hours as needed (nausea). 11/22/12  Yes Si Gaul, MD  traZODone (DESYREL) 50 MG tablet Take 2 tablets by mouth at bedtime as needed for sleep.  02/11/13  Yes Historical Provider, MD  bumetanide (BUMEX) 1 MG tablet Take 1 mg by mouth every morning.     Historical Provider, MD  methylPREDNISolone (MEDROL DOSEPAK) 4 MG tablet Take 4-24 mg by mouth See admin instructions. Taper down following package directions 11/29/12    Historical Provider, MD   No Known Allergies  FAMILY HISTORY:  History reviewed. No pertinent family history. SOCIAL HISTORY:  reports that he quit smoking about 39 years ago. His smoking use included Cigarettes and Pipe. He has a 20 pack-year smoking history. He has never used smokeless tobacco. He reports that he does not drink alcohol. His drug history is not on file.  REVIEW OF SYSTEMS:  10 point review of system taken, please see HPI for positives and negatives. Wife provided most of information.  SUBJECTIVE:   VITAL SIGNS: Temp:  [98 F (36.7 C)] 98 F (36.7 C) (10/20 0203) Pulse Rate:  [78-119] 82 (10/20 0548) Resp:  [16-47] 24 (10/20 0548) BP: (67-143)/(31-117) 121/63 mmHg (10/20 0548) SpO2:  [88 %-100 %] 95 % (10/20 0548) HEMODYNAMICS:   VENTILATOR SETTINGS:   INTAKE / OUTPUT: Intake/Output   None     PHYSICAL EXAMINATION: General:  Frail elderly wm nad at rest Neuro:  Follows commands by MAEx4, PERL,speech clear HEENT:  No LAN/JVD Cardiovascular:  HSR RRR Lungs:  diminished air movement thru out, left ct to 20 cm pleurovac no air leak Abdomen: tender. +bs Musculoskeletal: intact Skin:  cool  LABS:  CBC Recent Labs     03/11/13  0240  WBC  0.5*  HGB  8.2*  HCT  24.2*  PLT  53*   Coag's Recent Labs     03/11/13  0240  APTT  39*  INR  1.58*   BMET Recent Labs     03/11/13  0240  NA  129*  K  4.3  CL  94*  CO2  19  BUN  65*  CREATININE  2.75*  GLUCOSE  248*   Electrolytes Recent Labs     03/11/13  0240  CALCIUM  8.4   Sepsis Markers No results found for this basename: LACTICACIDVEN, PROCALCITON, O2SATVEN,  in the last 72 hours ABG No results found for this basename: PHART, PCO2ART, PO2ART,  in the last 72 hours Liver Enzymes Recent Labs     03/11/13  0240  AST  45*  ALT  27  ALKPHOS  478*  BILITOT  2.6*  ALBUMIN  2.2*   Cardiac Enzymes Recent Labs     03/11/13  0240  TROPONINI  <0.30   Glucose No results found  for this basename: GLUCAP,  in the last 72 hours  Imaging Ct Abdomen Pelvis Wo Contrast  03/11/2013   CLINICAL DATA:  Shortness of breath and abdominal pain. Left pneumothorax on chest x-ray. History of lung cancer with liver metastasis. Ongoing chemotherapy.  EXAM: CT CHEST, ABDOMEN AND PELVIS WITHOUT CONTRAST  TECHNIQUE: Multidetector CT imaging of the chest, abdomen and pelvis was performed following the standard protocol without IV contrast.  COMPARISON:  02/21/2013  FINDINGS: CT CHEST FINDINGS  There is a large left-sided pneumothorax with collapse of the left  lung. The mass lesion seen previously in the left lung is obscured due to the pulmonary collapse. There is diffuse fibrosis and emphysematous change in the right lung. Motion artifact limits visualization of the lungs. Mass in the central right upper lobe abutting the posterior aspect of the right hilar structures is stable. This measures approximately 2.3 cm maximal diameter. Secretions are present in the tracheobronchial tree.  Normal heart size. Normal caliber thoracic aorta. Calcification of coronary arteries and aorta. Esophagus contains contrast which could represent dysmotility or reflux. Suggestion of wall thickening of the lower esophagus which may represent reflux disease. No significant effusion.  CT ABDOMEN AND PELVIS FINDINGS  Configuration of the liver suggests cirrhosis. Heterogeneous parenchymal pattern consistent with known metastases. Visualization of metastasis is limited due to lack of contrast material. Cholelithiasis appears improved since previous study. Spleen size is normal. The unenhanced appearance of the pancreas, adrenal glands, kidneys, inferior vena cava, and retroperitoneal lymph nodes is unremarkable. Calcification of the abdominal aorta without aneurysm. The gastric wall is not thickened. Small bowel are not abnormally distended. Fluid filled colon without distention. No free air or free fluid in the abdomen.   Pelvis: Diverticulosis of the sigmoid colon. There are infiltrative changes around the sigmoid colon suggesting early diverticulitis. No abscess. Small amount of free fluid in the pelvis likely arises from the inflammatory reaction. Bladder wall is not thickened. Prostate gland is enlarged. Appendix is not visualized.  Degenerative changes throughout the thoracic and lumbar spine. Spondylolysis and mild spondylolisthesis at L5-S1. Diffuse bone demineralization. No focal metastases are demonstrated in the bones. No visualized rib fractures.  IMPRESSION: CT CHEST IMPRESSION  Large left-sided pneumothorax as shown on previous chest x-ray. Collapse of the left lung. Left lung mass is obscured by the pulmonary collapse. Right lung mass is stable. Diffuse emphysema and fibrosis in the right lung. Secretions in the tracheobronchial tree.  CT ABDOMEN AND PELVIS IMPRESSION  Inflammatory changes in the sigmoid colon consistent with diverticulitis. No abscess. Cirrhosis of the liver. Heterogeneous appearance of the liver consistent with known metastasis. Cholelithiasis.   Electronically Signed   By: Burman Nieves M.D.   On: 03/11/2013 04:30   Ct Head Wo Contrast  03/11/2013   *RADIOLOGY REPORT*  Clinical Data: Fall, head trauma.  History of lung cancer with liver metastasis.  CT HEAD WITHOUT CONTRAST  Technique:  Contiguous axial images were obtained from the base of the skull through the vertex without contrast.  Comparison: MRI of the brain November 30, 2012.  Findings: Mildly motion degraded examination. Skull base was rescanned with mild image quality improvement.  The ventricles and sulci are normal for age.  No intraparenchymal hemorrhage, mass effect nor midline shift.  Patchy supratentorial white matter hypodensities are within normal range for patient's age and though non-specific suggest sequalae of chronic small vessel ischemic disease. Sub centimeter hypodensity in left basal ganglia was present previously,  consistent with lacunar infarct. No acute large vascular territory infarcts.  No abnormal extra-axial fluid collections.  Basal cisterns are patent. Mild calcific atherosclerosis of the carotid siphons.  No skull fracture.  Visualized paranasal sinuses and mastoid aircells are well-aerated.  The included ocular globes and orbital contents are non-suspicious.  IMPRESSION: No acute intracranial process on this mildly motion degraded examination.  Moderate white matter changes suggest chronic small vessel ischemic disease with remote left basal ganglia lacunar infarct, stable from MRI of the brain November 30, 2012,   Original Report Authenticated By: Awilda Metro   Ct Chest  Wo Contrast  03/11/2013   CLINICAL DATA:  Shortness of breath and abdominal pain. Left pneumothorax on chest x-ray. History of lung cancer with liver metastasis. Ongoing chemotherapy.  EXAM: CT CHEST, ABDOMEN AND PELVIS WITHOUT CONTRAST  TECHNIQUE: Multidetector CT imaging of the chest, abdomen and pelvis was performed following the standard protocol without IV contrast.  COMPARISON:  02/21/2013  FINDINGS: CT CHEST FINDINGS  There is a large left-sided pneumothorax with collapse of the left lung. The mass lesion seen previously in the left lung is obscured due to the pulmonary collapse. There is diffuse fibrosis and emphysematous change in the right lung. Motion artifact limits visualization of the lungs. Mass in the central right upper lobe abutting the posterior aspect of the right hilar structures is stable. This measures approximately 2.3 cm maximal diameter. Secretions are present in the tracheobronchial tree.  Normal heart size. Normal caliber thoracic aorta. Calcification of coronary arteries and aorta. Esophagus contains contrast which could represent dysmotility or reflux. Suggestion of wall thickening of the lower esophagus which may represent reflux disease. No significant effusion.  CT ABDOMEN AND PELVIS FINDINGS  Configuration of  the liver suggests cirrhosis. Heterogeneous parenchymal pattern consistent with known metastases. Visualization of metastasis is limited due to lack of contrast material. Cholelithiasis appears improved since previous study. Spleen size is normal. The unenhanced appearance of the pancreas, adrenal glands, kidneys, inferior vena cava, and retroperitoneal lymph nodes is unremarkable. Calcification of the abdominal aorta without aneurysm. The gastric wall is not thickened. Small bowel are not abnormally distended. Fluid filled colon without distention. No free air or free fluid in the abdomen.  Pelvis: Diverticulosis of the sigmoid colon. There are infiltrative changes around the sigmoid colon suggesting early diverticulitis. No abscess. Small amount of free fluid in the pelvis likely arises from the inflammatory reaction. Bladder wall is not thickened. Prostate gland is enlarged. Appendix is not visualized.  Degenerative changes throughout the thoracic and lumbar spine. Spondylolysis and mild spondylolisthesis at L5-S1. Diffuse bone demineralization. No focal metastases are demonstrated in the bones. No visualized rib fractures.  IMPRESSION: CT CHEST IMPRESSION  Large left-sided pneumothorax as shown on previous chest x-ray. Collapse of the left lung. Left lung mass is obscured by the pulmonary collapse. Right lung mass is stable. Diffuse emphysema and fibrosis in the right lung. Secretions in the tracheobronchial tree.  CT ABDOMEN AND PELVIS IMPRESSION  Inflammatory changes in the sigmoid colon consistent with diverticulitis. No abscess. Cirrhosis of the liver. Heterogeneous appearance of the liver consistent with known metastasis. Cholelithiasis.   Electronically Signed   By: Burman Nieves M.D.   On: 03/11/2013 04:30   Dg Chest Portable 1 View  03/11/2013   CLINICAL DATA:  Post chest tube insertion.  EXAM: PORTABLE CHEST - 1 VIEW  COMPARISON:  03/11/2013 at 0234 hr  FINDINGS: Interval placement of a left  chest tube with evacuation of the left pneumothorax and re-expansion of most of the lung. There is residual atelectasis in the left base. Small amount of subcutaneous emphysema in the left chest wall. Shallow inspiration. Borderline heart size with normal pulmonary vascularity. Stable appearance of central venous catheter. Calcification of the aorta. Degenerative changes in the shoulders.  IMPRESSION: Interval placement of left chest tube with evacuation of left pneumothorax and re-expansion of the left lung. Subcutaneous emphysema in the left chest wall.   Electronically Signed   By: Burman Nieves M.D.   On: 03/11/2013 05:40   Dg Chest Port 1 View  03/11/2013  CLINICAL DATA:  Shortness of breath after a fall.  EXAM: PORTABLE CHEST - 1 VIEW  COMPARISON:  02/22/2013  FINDINGS: Shallow inspiration. Motion artifact limits the study. There is evidence of a left-sided pneumothorax with estimated volume of about 30%. There is probable lung collapse in the lower lung region. No definite mediastinal shift. The patient is rotated. Heart size appears normal. Right central venous catheter is unchanged in position. Calcification of the aorta. No visible rib fractures.  IMPRESSION: Left-sided pneumothorax with atelectasis in the left lower lung.  Critical value results were discussed with Dr. Ranae Palms at 0254 hr on 03/11/2013.   Electronically Signed   By: Burman Nieves M.D.   On: 03/11/2013 02:56     CXR: see above  ASSESSMENT / PLAN:  PULMONARY A: SCLCA dx 6-16, chemo per [facility] since 12-04-12 , last 10-13 with poor response and mets to bilary system. PF dx by Dr. Sherene Sires 2007, SOB increased over the years P:   O2 as needed BD's Intubation if needed Discuss EOL issues with spouse and daughter (who is an Charity fundraiser at [employer]) CARDIOVASCULAR A: Shock presumed sepsis from lower GI process complicated by tension pnx PAF P:  Treat as sepsis Repeat 12 lead. No anticoagulation  Due to  thrombocytopenia  RENAL Lab Results  Component Value Date   CREATININE 2.75* 03/11/2013   CREATININE 1.5* 03/04/2013   CREATININE 1.1 02/25/2013   CREATININE 1.4* 02/04/2013   CREATININE 0.95 12/07/2012   CREATININE 0.98 12/07/2012    A:  Acute renal insuff P:   Hydrate Monitor creatine  GASTROINTESTINAL A:  Presumed lower gi infection and inflammation cannot r/o cancer spread , Inflammatory process in diverticulum P:   PPI Abx Culture stool  HEMATOLOGIC A:  Thrombocytopenia leukopenia presumed from recent chemo P:  Monitor labs Minimize invasive procedures Hemonc consult   INFECTIOUS A: Presumed lower GI infection P:   Check procalcitonin See flows for abx and micro  ENDOCRINE A:   DM Chronic decadron treatment with chemo P:   SSI Check cortisol Empiric steroids  NEUROLOGIC A:  Frail but appears intact P:   ICU monitoring   I updated spouse and daughter at bedside.  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 Further he had a Lt pnx that went from  small  To a tension Pnx and required a urgent left chest tube per EDP. PCCM asked to admit.  Pt seen and examined with NP.  I agree with the above note. Plan is supportive care and trt infection.  Pt high risk need for vent support.  Pt wife somewhat uncertain of his status and did not have a good understanding of the situation.  CC [address]  Dorcas Carrow [place]  [phone]  Cell  [phone]  If no response or cell goes to voicemail, call beeper [phone]

## 2013-03-11 NOTE — Progress Notes (Signed)
ANTIBIOTIC CONSULT NOTE - INITIAL  Pharmacy Consult for Zosyn, vancomycin Indication: severe sepsis  No Known Allergies  Patient Measurements:   On 02/25/13: Wt 82kg, Ht 175cm  Vital Signs: Temp: 98 F (36.7 C) (10/20 0203) Temp src: Oral (10/20 0203) BP: 140/77 mmHg (10/20 0645) Pulse Rate: 88 (10/20 0645) Intake/Output from previous day:   Intake/Output from this shift:    Labs:  Recent Labs  03/11/13 0240  WBC 0.5*  HGB 8.2*  PLT 53*  CREATININE 2.75*   The CrCl is unknown because both a height and weight (above a minimum accepted value) are required for this calculation. No results found for this basename: VANCOTROUGH, VANCOPEAK, VANCORANDOM, GENTTROUGH, GENTPEAK, GENTRANDOM, TOBRATROUGH, TOBRAPEAK, TOBRARND, AMIKACINPEAK, AMIKACINTROU, AMIKACIN,  in the last 72 hours   Microbiology: No results found for this or any previous visit (from the past 720 hour(s)).  Medical History: Past Medical History  Diagnosis Date  . Hypertension   . Pulmonary fibrosis 2010  . Sleep apnea   . Diabetes mellitus without complication   . GERD (gastroesophageal reflux disease)   . Arthritis   . Anxiety   . Cancer     Medications:  Scheduled:  . insulin aspart  0-9 Units Subcutaneous Q4H  . pantoprazole (PROTONIX) IV  40 mg Intravenous QHS   Infusions:  . piperacillin-tazobactam 3.375 g (03/11/13 0702)  . sodium chloride     PRN: sodium chloride  Assessment: 79 y/o M with extensive-stage SCLC including liver and bone metastases (on chemotherapy [cisplatin / irinotecan, last received 10/13]) presented to ED with abd pain and hypotension, admitted with severe sepsis presumed from GI source, neutropenia, AKI. Also has tension pneumothorax that required urgent placement of L chest tube. Empiric Zosyn/vanco started in ED, to continue with pharmacy dosing.  Zosyn 3.375 grams x 1 and vancomycin 1 gram x 1 were already in administered in ED.  Goal of Therapy:  Eradication of  infection Zosyn dosage adjusted for renal function Vancomycin trough 15-20  Plan:  1. Zosyn 3.375 grams IV q8h (extended infusion, each dose over 4 hours) - next dose 1pm. 2. Vancomycin 500 mg IV x 1 at 10am (to complete total loading dose of 1500 mg today), then 1000 mg IV q24h. 3. Recheck serum creatinine in AM.  Unless azotemia improves when adequate BP is restored, a lower vancomycin maintenance dosage may be needed. 4. Follow vancomycin serum levels - may need a pre-steady-state level checked (see above). 5. Await culture results.  Elie Goody, PharmD, BCPS Pager: [phone] 03/11/2013  7:39 AM       Phillip [name], Phillip [name] 03/11/2013,7:30 AM

## 2013-03-11 NOTE — ED Notes (Signed)
Bed: [id number] Expected date: 03/11/13 Expected time: 1:43 AM Means of arrival: Ambulance Comments: abd pain, cancer pt

## 2013-03-11 NOTE — ED Notes (Signed)
Per EMS report: pt had three falls today and pt is currently c/o of abd pain. Pt also reports increased weakness today. Pt a/o x 4. Skin warm and dry.  Pt hx of DM but has not eaten or taken insulin. EMS CBG: 300,BP: 142/98, HR: 112, RR: 20, 91% on Thorp 3L. Pt taking vicodin with no relief.

## 2013-03-11 NOTE — ED Provider Notes (Signed)
CSN: [id number]     Arrival date & time 03/11/13  0158 History   First MD Initiated Contact with Patient 03/11/13 0208     Chief Complaint  Patient presents with  . Fall  . Abdominal Pain   (Consider location/radiation/quality/duration/timing/severity/associated sxs/prior Treatment) HPI She presents by EMS for increasing weakness with several falls today and lower abdominal pain. Patient has extensive small cell lung carcinoma and is followed by Dr. Arbutus Ped. Patient states the abdominal pain worsened over the past 24 hours. He's had no nausea or vomiting with it. He did have a bowel movement but a small amount of blood. He also complains of generalized weakness has had several falls today. He admits to striking his head on one of these falls. He denies any loss of consciousness. He has no focal weakness. Patient states he's been taking his Vicodin without relief of pain. Patient admits to baseline tachypnea and shortness of breath which is unchanged. Currently denies any chest pain Past Medical History  Diagnosis Date  . Hypertension   . Pulmonary fibrosis 2010  . Sleep apnea   . Diabetes mellitus without complication   . GERD (gastroesophageal reflux disease)   . Arthritis   . Anxiety   . Cancer    Past Surgical History  Procedure Laterality Date  . Prostate surgery      Patient wife states they rimmed out prostate   History reviewed. No pertinent family history. History  Substance Use Topics  . Smoking status: Former Smoker -- 1.00 packs/day for 20 years    Types: Cigarettes, Pipe    Quit date: 11/15/1973  . Smokeless tobacco: Never Used  . Alcohol Use: No    Review of Systems  Constitutional: Positive for fatigue. Negative for fever.  Respiratory: Positive for shortness of breath. Negative for cough.   Cardiovascular: Negative for chest pain, palpitations and leg swelling.  Gastrointestinal: Positive for abdominal pain and blood in stool. Negative for nausea, vomiting,  diarrhea and constipation.  Musculoskeletal: Negative for back pain.  Skin: Negative for rash and wound.  Neurological: Positive for weakness (Generalized). Negative for syncope, light-headedness, numbness and headaches.  All other systems reviewed and are negative.    Allergies  Review of patient's allergies indicates no known allergies.  Home Medications   Current Outpatient Rx  Name  Route  Sig  Dispense  Refill  . allopurinol (ZYLOPRIM) 100 MG tablet   Oral   Take 200 mg by mouth daily.         Marland Kitchen aspirin 81 MG tablet   Oral   Take 81 mg by mouth daily.         . bumetanide (BUMEX) 1 MG tablet   Oral   Take 1 mg by mouth daily.         . chlorproMAZINE (THORAZINE) 25 MG tablet   Oral   Take 25 mg by mouth 3 (three) times daily as needed (hiccups.).         Marland Kitchen colchicine 0.6 MG tablet   Oral   Take 0.6 mg by mouth 2 (two) times daily as needed (gout).         . diazepam (VALIUM) 5 MG tablet   Oral   Take 5 mg by mouth at bedtime as needed for sleep.         Marland Kitchen HYDROcodone-acetaminophen (NORCO) 10-325 MG per tablet   Oral   Take 1 tablet by mouth every 6 (six) hours as needed for pain.   120 tablet  0   . Insulin Isophane & Regular (NOVOLIN 70/30 RELION [place])   Subcutaneous   Inject 55 Units into the skin 2 (two) times daily.         Marland Kitchen lidocaine-prilocaine (EMLA) cream   Topical   Apply topically as needed.   30 g   0   . LORazepam (ATIVAN) 1 MG tablet   Oral   Take 1 tablet (1 mg total) by mouth every 8 (eight) hours as needed for anxiety.   90 tablet   0   . losartan (COZAAR) 100 MG tablet   Oral   Take 100 mg by mouth daily. Takes 1/2 tablet daily         . Magnesium-Zinc (MAGNESIUM-CHELATED ZINC PO)   Oral   Take 1 tablet by mouth 3 (three) times daily.         . methylPREDNISolone (MEDROL DOSEPAK) 4 MG tablet   Oral   Take by mouth. follow package directions         . methylPREDNIsolone (MEDROL DOSPACK) 4 MG tablet       follow package directions   21 tablet   0   . mirtazapine (REMERON) 30 MG tablet   Oral   Take 1 tablet (30 mg total) by mouth at bedtime.   30 tablet   2   . Multiple Vitamin (MULTIVITAMIN WITH MINERALS) TABS   Oral   Take 1 tablet by mouth daily.         . penicillin v potassium (VEETID) 500 MG tablet   Oral   Take 500 mg by mouth 4 (four) times daily. For 7 days.         . prochlorperazine (COMPAZINE) 10 MG tablet   Oral   Take 1 tablet (10 mg total) by mouth every 6 (six) hours as needed.   60 tablet   0    BP 143/117  Pulse 119  Temp(Src) 98 F (36.7 C) (Oral)  Resp 30  SpO2 100% Physical Exam  Nursing note and vitals reviewed. Constitutional: He is oriented to person, place, and time. He appears well-developed and well-nourished. No distress.  HENT:  Head: Normocephalic and atraumatic.  Dry mucous membranes  Eyes: EOM are normal. Pupils are equal, round, and reactive to light.  Neck: Normal range of motion. Neck supple.  Cardiovascular: Regular rhythm.   Tachycardia  Pulmonary/Chest: Breath sounds normal. No respiratory distress. He has no wheezes. He has no rales. He exhibits no tenderness.  Tachypnea  Abdominal: Soft. Bowel sounds are normal. He exhibits no distension and no mass. There is tenderness (Tenderness to palpation the left lower quadrant and right lower quadrant with some guarding.). There is guarding. There is no rebound.  Musculoskeletal: Normal range of motion. He exhibits no edema and no tenderness.  Neurological: He is oriented to person, place, and time.  Drowsy but easily aroused. Moves all extremities equally of weak diffusely. Sensation grossly intact  Skin: Skin is warm and dry. No rash noted. No erythema.    ED Course  CHEST TUBE INSERTION Date/Time: 03/11/2013 5:35 AM Performed by: Loren Racer Authorized by: Loren Racer Consent: Verbal consent obtained. The procedure was performed in an emergent situation. Consent  given by: patient Site marked: the operative site was marked Patient identity confirmed: verbally with patient Indications: pneumothorax and tension pneumothorax Patient sedated: no Anesthesia: local infiltration Local anesthetic: lidocaine 2% with epinephrine Anesthetic total: 5 ml Preparation: skin prepped with ChloraPrep Placement location: left lateral Scalpel size: 11 Tube  size: 20 [place] Dissection instrument: Kelly clamp Ultrasound guidance: no Tension pneumothorax heard: yes Tube connected to: suction Suture material: 2-0 silk Post-insertion x-ray findings: tube in good position Patient tolerance: Patient tolerated the procedure well with no immediate complications.   (including critical care time) Labs Review Labs Reviewed - No data to display Imaging Review No results found.  EKG Interpretation     Ventricular Rate:  116 PR Interval:    QRS Duration: 86 QT Interval:  445 QTC Calculation: 619 R Axis:   -79 Text Interpretation:  Sinus tachycardia Left anterior fascicular block Low voltage, precordial leads Anteroseptal infarct, age indeterminate First degree A-V block           CRITICAL CARE Performed by: Ranae Palms, Zae Kirtz Total critical care time: 60 min Critical care time was exclusive of separately billable procedures and treating other patients. Critical care was necessary to treat or prevent imminent or life-threatening deterioration. Critical care was time spent personally by me on the following activities: development of treatment plan with patient and/or surrogate as well as nursing, discussions with consultants, evaluation of patient's response to treatment, examination of patient, obtaining history from patient or surrogate, ordering and performing treatments and interventions, ordering and review of laboratory studies, ordering and review of radiographic studies, pulse oximetry and re-evaluation of patient's condition.  MDM   Discussed x-ray  finding with Dr. Daphine Deutscher. States he would not perform any acute intervention given patient's comorbidities and current vital sign stability. Advise to pursue further characterization of the pneumothorax and cause. Admit to medicine and will likely need IR consult for pig-tailed cath placement.   Patient is now much more alert. He states his abdominal pain is improving. He continues to be tachypneic but is speaking in full sentences.  Patient's blood pressure started to wane. IV fluids given. CT shows progressive collapse of the left long period concern for development of tension pneumothorax. Discussed with Dr. Daphine Deutscher who is in agreement to go ahead and place chest tube in emergency department.  Chest tube placed understood her condition since left chest. Initial hypotension resolved with IVF's. Left lung was reinflated. ET tube was secured in place and placed on wall suction. Patient states he is feeling slightly better. Broad spectrum IV antibiotics initiated. Discussed with critical care team. Will see the patient in the emergency department.    Loren Racer, MD 03/11/13 [phone]

## 2013-03-11 NOTE — ED Notes (Signed)
Pt aware of need for urine sample. Pt unable to provide one at this time. 

## 2013-03-12 DIAGNOSIS — J8410 Pulmonary fibrosis, unspecified: Secondary | ICD-10-CM

## 2013-03-12 DIAGNOSIS — D6959 Other secondary thrombocytopenia: Secondary | ICD-10-CM

## 2013-03-12 DIAGNOSIS — A419 Sepsis, unspecified organism: Principal | ICD-10-CM

## 2013-03-12 DIAGNOSIS — R079 Chest pain, unspecified: Secondary | ICD-10-CM

## 2013-03-12 DIAGNOSIS — Z515 Encounter for palliative care: Secondary | ICD-10-CM

## 2013-03-12 DIAGNOSIS — C787 Secondary malignant neoplasm of liver and intrahepatic bile duct: Secondary | ICD-10-CM

## 2013-03-12 DIAGNOSIS — C7A1 Malignant poorly differentiated neuroendocrine tumors: Secondary | ICD-10-CM

## 2013-03-12 DIAGNOSIS — D61818 Other pancytopenia: Secondary | ICD-10-CM

## 2013-03-12 LAB — GLUCOSE, CAPILLARY
Glucose-Capillary: 158 mg/dL — ABNORMAL HIGH (ref 70–99)
Glucose-Capillary: 141 mg/dL — ABNORMAL HIGH (ref 70–99)
Glucose-Capillary: 169 mg/dL — ABNORMAL HIGH (ref 70–99)

## 2013-03-12 LAB — BLOOD GAS, ARTERIAL
Bicarbonate: 15.4 mEq/L — ABNORMAL LOW (ref 20.0–24.0)
Patient temperature: 98.6
TCO2: 14.2 mmol/L (ref 0–100)
pCO2 arterial: 25.5 mmHg — ABNORMAL LOW (ref 35.0–45.0)
pH, Arterial: 7.399 (ref 7.350–7.450)
pO2, Arterial: 118 mmHg — ABNORMAL HIGH (ref 80.0–100.0)

## 2013-03-12 LAB — CBC
HCT: 17.1 % — ABNORMAL LOW (ref 39.0–52.0)
Hemoglobin: 6 g/dL — CL (ref 13.0–17.0)
MCH: 34.5 pg — ABNORMAL HIGH (ref 26.0–34.0)
MCHC: 35.1 g/dL (ref 30.0–36.0)
RBC: 1.74 MIL/uL — ABNORMAL LOW (ref 4.22–5.81)

## 2013-03-12 LAB — BASIC METABOLIC PANEL
BUN: 64 mg/dL — ABNORMAL HIGH (ref 6–23)
CO2: 18 mEq/L — ABNORMAL LOW (ref 19–32)
Chloride: 104 mEq/L (ref 96–112)
GFR calc Af Amer: 28 mL/min — ABNORMAL LOW (ref >90)
GFR calc non Af Amer: 24 mL/min — ABNORMAL LOW (ref >90)
Glucose, Bld: 174 mg/dL — ABNORMAL HIGH (ref 70–99)
Potassium: 4 mEq/L (ref 3.5–5.1)

## 2013-03-12 LAB — LEGIONELLA ANTIGEN, URINE

## 2013-03-12 LAB — URINE CULTURE: Colony Count: NO GROWTH

## 2013-03-12 LAB — PREPARE RBC (CROSSMATCH)

## 2013-03-12 LAB — ABO/RH: ABO/RH(D): A POS

## 2013-03-12 MED ORDER — ACETAMINOPHEN 325 MG PO TABS
650.0000 mg | ORAL_TABLET | Freq: Once | ORAL | Status: DC
  Filled 2013-03-12: qty 2

## 2013-03-12 MED ORDER — ACETAMINOPHEN 650 MG RE SUPP
650.0000 mg | Freq: Once | RECTAL | Status: AC
  Administered 2013-03-12: 650 mg via RECTAL
  Filled 2013-03-12: qty 1

## 2013-03-12 MED ORDER — DIPHENHYDRAMINE HCL 50 MG/ML IJ SOLN
25.0000 mg | Freq: Once | INTRAMUSCULAR | Status: AC
  Administered 2013-03-12: 25 mg via INTRAVENOUS
  Filled 2013-03-12: qty 1

## 2013-03-12 MED ORDER — HEPARIN SOD (PORK) LOCK FLUSH 100 UNIT/ML IV SOLN
250.0000 [IU] | INTRAVENOUS | Status: DC | PRN
  Filled 2013-03-12: qty 3

## 2013-03-12 MED ORDER — HEPARIN SOD (PORK) LOCK FLUSH 100 UNIT/ML IV SOLN
500.0000 [IU] | Freq: Every day | INTRAVENOUS | Status: DC | PRN
  Filled 2013-03-12: qty 5

## 2013-03-12 MED ORDER — SODIUM CHLORIDE 0.9 % IJ SOLN: 10.0000 mL | INTRAMUSCULAR | Status: DC | PRN

## 2013-03-12 MED ORDER — FILGRASTIM 480 MCG/1.6ML IJ SOLN
480.0000 ug | Freq: Every day | INTRAMUSCULAR | Status: DC
  Administered 2013-03-12: 480 ug via SUBCUTANEOUS
  Filled 2013-03-12 (×2): qty 1.6

## 2013-03-12 MED ORDER — SODIUM CHLORIDE 0.9 % IJ SOLN: 3.0000 mL | INTRAMUSCULAR | Status: DC | PRN

## 2013-03-12 MED ORDER — SODIUM CHLORIDE 0.9 % IV SOLN
250.0000 mL | Freq: Once | INTRAVENOUS | Status: AC
  Administered 2013-03-12: 250 mL via INTRAVENOUS

## 2013-03-12 NOTE — Progress Notes (Signed)
INITIAL NUTRITION ASSESSMENT  DOCUMENTATION CODES Per approved criteria  -Not Applicable   INTERVENTION: Diet advancement per MD discretion Provide Resource Breeze and Glucerna once daily when diet advanced Provide Snacks BID when diet advanced  NUTRITION DIAGNOSIS: Inadequate oral intake related to poor appetite as evidenced by 13% wt loss in less than 4 months.   Goal: Pt to meet >/= 90% of their estimated nutrition needs   Monitor:  Diet advancement PO intake Weight Labs  Reason for Assessment: Malnutrition Screening Tool, score of 3  79 y.o. 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 Pt has history of diabetes, hypertension, sleep apnea, and GERD.   Pt resting at time of visit. Spoke with pt's wife and daughter who report that pt has had no appetite and no interest in food since starting chemotherapy. Family reports that pt has been eating very little daily; pt tried Ensure supplements but, didn't like them. Pt's weight history shows 13% wt loss since June 2014.   Height: Ht Readings from Last 1 Encounters:  03/12/13 5' 8.9" (1.75 m)    Weight: Wt Readings from Last 1 Encounters:  03/12/13 172 lb 2.9 oz (78.1 kg)    Ideal Body Weight: 160 lbs  % Ideal Body Weight: 108%  Wt Readings from Last 10 Encounters:  03/12/13 172 lb 2.9 oz (78.1 kg)  02/25/13 180 lb 11.2 oz (81.965 kg)  02/04/13 168 lb 1.6 oz (76.25 kg)  01/14/13 174 lb 14.4 oz (79.334 kg)  12/24/12 174 lb 14.4 oz (79.334 kg)  12/18/12 173 lb 11.2 oz (78.79 kg)  12/03/12 189 lb 6 oz (85.9 kg)  11/26/12 194 lb (87.998 kg)  11/22/12 190 lb 14.4 oz (86.592 kg)  11/15/12 198 lb (89.812 kg)    Usual Body Weight: 200 lbs  % Usual Body Weight: 86%  BMI:  Body mass index is 25.5  kg/(m^2).  Estimated Nutritional Needs: Kcal: 1900-2100 Protein: 90-110 grams Fluid: >/= 2.1 L/day  Skin: intact  Diet Order: NPO  EDUCATION NEEDS: -No education needs identified at this time   Intake/Output Summary (Last 24 hours) at 03/12/13 1251 Last data filed at 03/12/13 0900  Gross per 24 hour  Intake 1218.25 ml  Output   1500 ml  Net -281.75 ml    Last BM: 10/20  Labs:   Recent Labs Lab 03/11/13 0240 03/12/13 0630  NA 129* 134*  K 4.3 4.0  CL 94* 104  CO2 19 18*  BUN 65* 64*  CREATININE 2.75* 2.39*  CALCIUM 8.4 7.7*  GLUCOSE 248* 174*    CBG (last 3)   Recent Labs  03/12/13 0334 03/12/13 0801 03/12/13 1238  GLUCAP 146* 141* 169*    Scheduled Meds: . sodium chloride  250 mL Intravenous Once  . albuterol  2.5 mg Nebulization Q6H  . Chlorhexidine Gluconate Cloth  6 each Topical Q0600  . filgrastim  480 mcg Subcutaneous q1800  . hydrocortisone sod succinate (SOLU-CORTEF) inj  50 mg Intravenous Q6H  . insulin aspart  0-9 Units Subcutaneous Q4H  . ipratropium  0.5 mg Nebulization Q6H  . mupirocin ointment  1 application Nasal BID  . pantoprazole (PROTONIX) IV  40 mg Intravenous QHS  . piperacillin-tazobactam (ZOSYN)  IV  3.375 g Intravenous Q8H  . vancomycin  1,000 mg Intravenous Q24H  Continuous Infusions: . sodium chloride 75 mL/hr at 03/12/13 1154    Past Medical History  Diagnosis Date  . Hypertension   . Pulmonary fibrosis 2010  . Sleep apnea   . Diabetes mellitus without complication   . GERD (gastroesophageal reflux disease)   . Arthritis   . Anxiety   . Cancer     Past Surgical History  Procedure Laterality Date  . Prostate surgery      Patient wife states they rimmed out prostate  . Left chest tube insertion      Ian Malkin RD, LDN Inpatient Clinical Dietitian Pager: [phone] After Hours Pager: [phone]

## 2013-03-12 NOTE — Progress Notes (Signed)
Call from Dr Ladona Ridgel. Patient daughter agreeable  For full comfort but measures to be implemented after 7am by palliative care team. Currently continue ongoing medical care and concurrent palliative care including starting opiooid gtt if needed during deciine   ,Dr. Kalman Shan, M.D., [facility].C.P Pulmonary and Critical Care Medicine Staff Physician [facility] [place] [facility] Pager: [phone], If no answer or between  15:00h - 7:00h: call [phone]  03/12/2013 11:46 PM

## 2013-03-12 NOTE — Progress Notes (Signed)
Patient ZO:XWRUE H Willison      DOB: 04-12-1933      AVW:[id number]  Was able to speak with the patient's daughter late this evening she affirms full comfort care. Nothing that prolongs her father's life or suffering including antibiotics, feeding tubes etc. She has discussed continuous opiate infusions for pain and dyspnea control and has discussed this with her step mother.  Will review this with spouse in am and make adjustments.  Daughter open to residential hospice if stable for discharge.   Will update nursing and elink MD  Andron Marrazzo L. Ladona Ridgel, MD MBA The Palliative Medicine Team at [facility] Phone: [phone] Pager: [phone]

## 2013-03-12 NOTE — Progress Notes (Signed)
DIAGNOSIS AND STAGE: Extensive stage small cell lung cancer with bilateral pulmonary nodules, mediastinal and supraclavicular lymphadenopathy as well as liver and bone metastases diagnosed in July of 2014   PRIOR THERAPY: Systemic chemotherapy with carboplatin for AUC of 4 on day 1 and etoposide 80 mg/M2 on days 1, 2 and 3 with Neulasta support on day 4. Status post 3 cycles. First cycle was given on 12/04/2012 at [facility]. Last dose was given 02/04/2013 discontinued today secondary to disease progression.   CURRENT THERAPY: Systemic chemotherapy with cisplatin 30 MG/M2 and irinotecan 65 mg/M2 on days 1 and 8 every 3 weeks. First dose on 02/25/2013   CHEMOTHERAPY INTENT: Palliative  CURRENT # OF CHEMOTHERAPY CYCLES: 1  CURRENT ANTIEMETICS: Zofran, Compazine and dexamethasone  CURRENT SMOKING STATUS: Currently Nonsmoker, quit smoking 42 years ago.  ORAL CHEMOTHERAPY AND CONSENT: None  CURRENT BISPHOSPHONATES USE: None  PAIN MANAGEMENT: No pain  NARCOTICS INDUCED CONSTIPATION: No constipation  LIVING WILL AND CODE STATUS:   Subjective: The patient is seen and examined today. His wife was at the bedside. He is a very pleasant 79 years old white male with extensive stage small cell lung cancer status post 4 cycles of systemic chemotherapy with carboplatin and etoposide but unfortunately has evidence for disease progression. He was started on second line chemotherapy with cisplatin and irinotecan status post 1 cycle. He was admitted to [facility] on the 03/11/2013 with frequent falls as well as abdominal pain. Blood work showed pancytopenia. Imaging studies including CT scan of the chest, abdomen and pelvis showed large left-sided pneumothorax with collapse of the left lung. CT of the abdomen showed inflammatory changes in the sigmoid colon and consistent with diverticulitis but no abscess. He also has cirrhosis of the liver. His CBC today showed significant pancytopenia was total  white blood count of 0.2, hemoglobin 6.0 G./DL and a platelet count of 18,000. The patient doesn't look good and he is severely distress.  Objective: Vital signs in last 24 hours: Temp:  [98.4 F (36.9 C)-99.4 F (37.4 C)] 98.4 F (36.9 C) (10/21 0400) Pulse Rate:  [62-116] 107 (10/21 0700) Resp:  [17-40] 27 (10/21 0700) BP: (92-183)/(46-109) 140/78 mmHg (10/21 0700) SpO2:  [95 %-100 %] 100 % (10/21 0700) Weight:  [172 lb 2.9 oz (78.1 kg)] 172 lb 2.9 oz (78.1 kg) (10/21 0500)  Intake/Output from previous day: 10/20 0701 - 10/21 0700 In: 1168.3 [I.V.:955.8; IV Piggyback:212.5] Out: 1435 [Urine:1425; Chest Tube:10] Intake/Output this shift:    General appearance: alert, distracted, fatigued and moderate distress Resp: diminished breath sounds RLL and RML and dullness to percussion RLL and RML Cardio: Tachycardic with no murmur or gallop. GI: Slightly distended with tenderness to palpation in the left lower quadrant Extremities: edema 2+  Lab Results:   Recent Labs  03/11/13 0240 03/12/13 0630  WBC 0.5* 0.2*  HGB 8.2* 6.0*  HCT 24.2* 17.1*  PLT 53* 18*   BMET  Recent Labs  03/11/13 0240 03/12/13 0630  NA 129* 134*  K 4.3 4.0  CL 94* 104  CO2 19 18*  GLUCOSE 248* 174*  BUN 65* 64*  CREATININE 2.75* 2.39*  CALCIUM 8.4 7.7*    Studies/Results: Ct Abdomen Pelvis Wo Contrast  03/11/2013   CLINICAL DATA:  Shortness of breath and abdominal pain. Left pneumothorax on chest x-ray. History of lung cancer with liver metastasis. Ongoing chemotherapy.  EXAM: CT CHEST, ABDOMEN AND PELVIS WITHOUT CONTRAST  TECHNIQUE: Multidetector CT imaging of the chest, abdomen and pelvis  was performed following the standard protocol without IV contrast.  COMPARISON:  02/21/2013  FINDINGS: CT CHEST FINDINGS  There is a large left-sided pneumothorax with collapse of the left lung. The mass lesion seen previously in the left lung is obscured due to the pulmonary collapse. There is diffuse  fibrosis and emphysematous change in the right lung. Motion artifact limits visualization of the lungs. Mass in the central right upper lobe abutting the posterior aspect of the right hilar structures is stable. This measures approximately 2.3 cm maximal diameter. Secretions are present in the tracheobronchial tree.  Normal heart size. Normal caliber thoracic aorta. Calcification of coronary arteries and aorta. Esophagus contains contrast which could represent dysmotility or reflux. Suggestion of wall thickening of the lower esophagus which may represent reflux disease. No significant effusion.  CT ABDOMEN AND PELVIS FINDINGS  Configuration of the liver suggests cirrhosis. Heterogeneous parenchymal pattern consistent with known metastases. Visualization of metastasis is limited due to lack of contrast material. Cholelithiasis appears improved since previous study. Spleen size is normal. The unenhanced appearance of the pancreas, adrenal glands, kidneys, inferior vena cava, and retroperitoneal lymph nodes is unremarkable. Calcification of the abdominal aorta without aneurysm. The gastric wall is not thickened. Small bowel are not abnormally distended. Fluid filled colon without distention. No free air or free fluid in the abdomen.  Pelvis: Diverticulosis of the sigmoid colon. There are infiltrative changes around the sigmoid colon suggesting early diverticulitis. No abscess. Small amount of free fluid in the pelvis likely arises from the inflammatory reaction. Bladder wall is not thickened. Prostate gland is enlarged. Appendix is not visualized.  Degenerative changes throughout the thoracic and lumbar spine. Spondylolysis and mild spondylolisthesis at L5-S1. Diffuse bone demineralization. No focal metastases are demonstrated in the bones. No visualized rib fractures.  IMPRESSION: CT CHEST IMPRESSION  Large left-sided pneumothorax as shown on previous chest x-ray. Collapse of the left lung. Left lung mass is obscured  by the pulmonary collapse. Right lung mass is stable. Diffuse emphysema and fibrosis in the right lung. Secretions in the tracheobronchial tree.  CT ABDOMEN AND PELVIS IMPRESSION  Inflammatory changes in the sigmoid colon consistent with diverticulitis. No abscess. Cirrhosis of the liver. Heterogeneous appearance of the liver consistent with known metastasis. Cholelithiasis.   Electronically Signed   By: Burman Nieves M.D.   On: 03/11/2013 04:30   Ct Head Wo Contrast  03/11/2013   *RADIOLOGY REPORT*  Clinical Data: Fall, head trauma.  History of lung cancer with liver metastasis.  CT HEAD WITHOUT CONTRAST  Technique:  Contiguous axial images were obtained from the base of the skull through the vertex without contrast.  Comparison: MRI of the brain November 30, 2012.  Findings: Mildly motion degraded examination. Skull base was rescanned with mild image quality improvement.  The ventricles and sulci are normal for age.  No intraparenchymal hemorrhage, mass effect nor midline shift.  Patchy supratentorial white matter hypodensities are within normal range for patient's age and though non-specific suggest sequalae of chronic small vessel ischemic disease. Sub centimeter hypodensity in left basal ganglia was present previously, consistent with lacunar infarct. No acute large vascular territory infarcts.  No abnormal extra-axial fluid collections.  Basal cisterns are patent. Mild calcific atherosclerosis of the carotid siphons.  No skull fracture.  Visualized paranasal sinuses and mastoid aircells are well-aerated.  The included ocular globes and orbital contents are non-suspicious.  IMPRESSION: No acute intracranial process on this mildly motion degraded examination.  Moderate white matter changes suggest chronic small vessel  ischemic disease with remote left basal ganglia lacunar infarct, stable from MRI of the brain November 30, 2012,   Original Report Authenticated By: Awilda Metro   Ct Chest Wo  Contrast  03/11/2013   CLINICAL DATA:  Shortness of breath and abdominal pain. Left pneumothorax on chest x-ray. History of lung cancer with liver metastasis. Ongoing chemotherapy.  EXAM: CT CHEST, ABDOMEN AND PELVIS WITHOUT CONTRAST  TECHNIQUE: Multidetector CT imaging of the chest, abdomen and pelvis was performed following the standard protocol without IV contrast.  COMPARISON:  02/21/2013  FINDINGS: CT CHEST FINDINGS  There is a large left-sided pneumothorax with collapse of the left lung. The mass lesion seen previously in the left lung is obscured due to the pulmonary collapse. There is diffuse fibrosis and emphysematous change in the right lung. Motion artifact limits visualization of the lungs. Mass in the central right upper lobe abutting the posterior aspect of the right hilar structures is stable. This measures approximately 2.3 cm maximal diameter. Secretions are present in the tracheobronchial tree.  Normal heart size. Normal caliber thoracic aorta. Calcification of coronary arteries and aorta. Esophagus contains contrast which could represent dysmotility or reflux. Suggestion of wall thickening of the lower esophagus which may represent reflux disease. No significant effusion.  CT ABDOMEN AND PELVIS FINDINGS  Configuration of the liver suggests cirrhosis. Heterogeneous parenchymal pattern consistent with known metastases. Visualization of metastasis is limited due to lack of contrast material. Cholelithiasis appears improved since previous study. Spleen size is normal. The unenhanced appearance of the pancreas, adrenal glands, kidneys, inferior vena cava, and retroperitoneal lymph nodes is unremarkable. Calcification of the abdominal aorta without aneurysm. The gastric wall is not thickened. Small bowel are not abnormally distended. Fluid filled colon without distention. No free air or free fluid in the abdomen.  Pelvis: Diverticulosis of the sigmoid colon. There are infiltrative changes around the  sigmoid colon suggesting early diverticulitis. No abscess. Small amount of free fluid in the pelvis likely arises from the inflammatory reaction. Bladder wall is not thickened. Prostate gland is enlarged. Appendix is not visualized.  Degenerative changes throughout the thoracic and lumbar spine. Spondylolysis and mild spondylolisthesis at L5-S1. Diffuse bone demineralization. No focal metastases are demonstrated in the bones. No visualized rib fractures.  IMPRESSION: CT CHEST IMPRESSION  Large left-sided pneumothorax as shown on previous chest x-ray. Collapse of the left lung. Left lung mass is obscured by the pulmonary collapse. Right lung mass is stable. Diffuse emphysema and fibrosis in the right lung. Secretions in the tracheobronchial tree.  CT ABDOMEN AND PELVIS IMPRESSION  Inflammatory changes in the sigmoid colon consistent with diverticulitis. No abscess. Cirrhosis of the liver. Heterogeneous appearance of the liver consistent with known metastasis. Cholelithiasis.   Electronically Signed   By: Burman Nieves M.D.   On: 03/11/2013 04:30   Dg Chest Port 1 View  03/11/2013   CLINICAL DATA:  Chest tube placement  EXAM: PORTABLE CHEST - 1 VIEW  COMPARISON:  03/11/2013  FINDINGS: Abnormal interstitial accentuation persists. Low lung volumes. Right Port-A-Cath tip: Cavoatrial junction. Larger caliber chest tube on the left is been treated out for a pleural drainage catheter. No discrete pneumothorax. Small amount of subcutaneous gas on the left.  Emphysema is noted.  IMPRESSION: 1. Left chest tube is been trait at L4 a pleural drainage catheter. No discrete persistent pneumothorax. 2. Low lung volumes, with abnormal accentuated interstitium. 3. Atherosclerosis. 4. Emphysema.   Electronically Signed   By: Herbie Baltimore M.D.   On: 03/11/2013  11:01   Dg Chest Portable 1 View  03/11/2013   CLINICAL DATA:  Post chest tube insertion.  EXAM: PORTABLE CHEST - 1 VIEW  COMPARISON:  03/11/2013 at 0234 hr   FINDINGS: Interval placement of a left chest tube with evacuation of the left pneumothorax and re-expansion of most of the lung. There is residual atelectasis in the left base. Small amount of subcutaneous emphysema in the left chest wall. Shallow inspiration. Borderline heart size with normal pulmonary vascularity. Stable appearance of central venous catheter. Calcification of the aorta. Degenerative changes in the shoulders.  IMPRESSION: Interval placement of left chest tube with evacuation of left pneumothorax and re-expansion of the left lung. Subcutaneous emphysema in the left chest wall.   Electronically Signed   By: Burman Nieves M.D.   On: 03/11/2013 05:40   Dg Chest Port 1 View  03/11/2013   CLINICAL DATA:  Shortness of breath after a fall.  EXAM: PORTABLE CHEST - 1 VIEW  COMPARISON:  02/22/2013  FINDINGS: Shallow inspiration. Motion artifact limits the study. There is evidence of a left-sided pneumothorax with estimated volume of about 30%. There is probable lung collapse in the lower lung region. No definite mediastinal shift. The patient is rotated. Heart size appears normal. Right central venous catheter is unchanged in position. Calcification of the aorta. No visible rib fractures.  IMPRESSION: Left-sided pneumothorax with atelectasis in the left lower lung.  Critical value results were discussed with Dr. Ranae Palms at 0254 hr on 03/11/2013.   Electronically Signed   By: Burman Nieves M.D.   On: 03/11/2013 02:56    Medications: I have reviewed the patient's current medications.  Assessment/Plan: 1) extensive stage small cell lung cancer with recent disease progression currently undergoing systemic chemotherapy with cisplatin and irinotecan status post 1 cycle. Unfortunately the patient has very poor prognosis and I am not sure if he would be able to tolerate any further chemotherapy. I discussed with the patient and his wife today consideration of palliative care and probably hospice  referral if he survived this admission. 2) severe pancytopenia: secondary to her recent chemotherapy and probably septic shock. I will arrange for the patient to receive 2 units of PRBCs transfusion today. I will also arrange for the patient to receive 1 unit of platelets today. I will start him on Neupogen 480 mcg subcutaneously daily until his absolute neutrophil count is over 1000. 3) Septic shock and diverticulitis: continue current antibiotics with Zosyn and vancomycin. 4) respiratory failure: secondary to the above. Continue supportive care by the critical care team. Thank you so much for taken good care of Mr. Macrae. I will continue to follow the patient with you and assist in his management.   LOS: 1 day    Manjot Hinks K. 03/12/2013

## 2013-03-12 NOTE — Consult Note (Signed)
Patient XB:JYNWG H Zavada      DOB: 03-08-1933      NFA:[id number]     Consult Note from the Palliative Medicine Team at [facility]    Consult Requested by: Dr. Delford Field     PCP: Louie Boston, MD Reason for Consultation: Goals of care     Phone Number:[phone] Related symptom recommendations Assessment of patients Current state: 79 year old white male with widely metastatic chemotherapy resistant pancreatic cancer . Patient was admitted with worsening mental status status post fall found to have a tension pneumothorax. A chest tube was emergently placed in the emergency room and he was admitted to ICU for respiratory failure. And that with the patient's spouse who is at times overwhelmed and slightly confused. She relates that she has communicated with the patient's daughter who is her stepdaughter. PT is an ICU nurse in [place] and had to leave this morning to get back to go to work. She is not available by phone at the time of my initial evaluation. Patient's spouse is clear that she wants her spouse comfortable but is not able to work through the individual pieces of comfort care at this time and asked that I call Vickie as soon as possible. Please see my summary on the date of service.   Goals of Care: 1.  Code Status: DO NOT RESUSCITATE   2. Scope of Treatment: Currently the patient is to receive platelets and 2 units of packed red cells and will communicate with Dr. Delford Field regarding whether this is compatible with a comfort care course. I will attempt to speak with the patient's daughter Lynden Ang his traveling to a firm further changes to the plan of care support a full comfort knowing that the patient will likely not survive this hospital stay.  4. Disposition: Would expect a hospital death a full comfort is initiated   3. Symptom Management:   1. Pain: Continue fentanyl wishes will as needed. We'll consider scheduled medications versus continuous infusion once goals are  clarified  4. Psychosocial: Patient has been married twice he is a daughter Lynden Ang who works in [place] is an Insurance underwriter. His wife was too distraught to give any further information about his past  5. Spiritual: Chaplain to support is offered        Patient Documents Completed or Given: Document Given Completed  Advanced Directives Pkt    MOST    DNR    Gone from My Sight    Hard Choices      Brief HPI: 79 year old white male with known widely metastatic pancreatic cancer resistant to chemotherapy admitted to the hospital status post fall with shortness of breath found to have a tension pneumothorax. Chest tube was placed. Patient continues to decline. We are asked to assist with goals of care related symptom management   ROS: Limited by patient's mental status but does complain of nausea and pain.    PMH:  Past Medical History  Diagnosis Date  . Hypertension   . Pulmonary fibrosis 2010  . Sleep apnea   . Diabetes mellitus without complication   . GERD (gastroesophageal reflux disease)   . Arthritis   . Anxiety   . Cancer      PSH: Past Surgical History  Procedure Laterality Date  . Prostate surgery      Patient wife states they rimmed out prostate  . Left chest tube insertion     I have reviewed the FH and SH and  If appropriate update it  with new information. No Known Allergies Scheduled Meds: . albuterol  2.5 mg Nebulization Q6H  . Chlorhexidine Gluconate Cloth  6 each Topical Q0600  . filgrastim  480 mcg Subcutaneous q1800  . hydrocortisone sod succinate (SOLU-CORTEF) inj  50 mg Intravenous Q6H  . insulin aspart  0-9 Units Subcutaneous Q4H  . ipratropium  0.5 mg Nebulization Q6H  . mupirocin ointment  1 application Nasal BID  . pantoprazole (PROTONIX) IV  40 mg Intravenous QHS  . piperacillin-tazobactam (ZOSYN)  IV  3.375 g Intravenous Q8H  . vancomycin  1,000 mg Intravenous Q24H   Continuous Infusions: . sodium chloride 75 mL/hr at 03/12/13  1154   PRN Meds:.fentaNYL, heparin lock flush, heparin lock flush, prochlorperazine, sodium chloride, sodium chloride, sodium chloride    BP 106/49  Pulse 102  Temp(Src) 99.4 F (37.4 C) (Oral)  Resp 34  Ht 5' 8.9" (1.75 m)  Wt 78.1 kg (172 lb 2.9 oz)  BMI 25.5 kg/m2  SpO2 100%   PPS: 10%   Intake/Output Summary (Last 24 hours) at 03/12/13 1849 Last data filed at 03/12/13 1430  Gross per 24 hour  Intake 1264.25 ml  Output   1360 ml  Net -95.75 ml    Physical Exam:  General: Minimally responsive but will open his eyes and answer a few questions yes and no, mild distress secondary to shortness of breath and pain also feels little nauseated HEENT: Pupils equal round reactive to light extraocular muscles appear to be intact mucous membranes are dry. he is very pale Chest:   Decreased with coarse bilateral rhonchi, no wheezing, chest tube in place CVS: Tachycardic positive S1 and S2 don't appreciate an S3 or S4 Abdomen: Mildly tender but no guarding slightly distended Ext: Edema throughout Neuro: Confused not oriented to time place and intermittently somnolent  Labs: CBC    Component Value Date/Time   WBC 0.2* 03/12/2013 0630   WBC 5.5 03/04/2013 0834   RBC 1.74* 03/12/2013 0630   RBC 2.98* 03/04/2013 0834   HGB 6.0* 03/12/2013 0630   HGB 10.1* 03/04/2013 0834   HCT 17.1* 03/12/2013 0630   HCT 30.1* 03/04/2013 0834   PLT 18* 03/12/2013 0630   PLT 135* 03/04/2013 0834   MCV 98.3 03/12/2013 0630   MCV 101.2* 03/04/2013 0834   MCH 34.5* 03/12/2013 0630   MCH 34.0* 03/04/2013 0834   MCHC 35.1 03/12/2013 0630   MCHC 33.6 03/04/2013 0834   RDW 21.9* 03/12/2013 0630   RDW 25.2* 03/04/2013 0834   LYMPHSABS 0.2* 03/11/2013 0240   LYMPHSABS 0.9 03/04/2013 0834   MONOABS 0.0* 03/11/2013 0240   MONOABS 0.5 03/04/2013 0834   EOSABS 0.0 03/11/2013 0240   EOSABS 0.0 03/04/2013 0834   BASOSABS 0.0 03/11/2013 0240   BASOSABS 0.1 03/04/2013 0834      CMP      Component Value Date/Time   NA 134* 03/12/2013 0630   NA 139 03/04/2013 0834   K 4.0 03/12/2013 0630   K 4.3 03/04/2013 0834   CL 104 03/12/2013 0630   CO2 18* 03/12/2013 0630   CO2 25 03/04/2013 0834   GLUCOSE 174* 03/12/2013 0630   GLUCOSE 160* 03/04/2013 0834   BUN 64* 03/12/2013 0630   BUN 34.2* 03/04/2013 0834   CREATININE 2.39* 03/12/2013 0630   CREATININE 1.5* 03/04/2013 0834   CALCIUM 7.7* 03/12/2013 0630   CALCIUM 8.5 03/04/2013 0834   PROT 6.7 03/11/2013 0240   PROT 7.5 03/04/2013 0834   ALBUMIN 2.2* 03/11/2013 0240  ALBUMIN 2.2* 03/04/2013 0834   AST 45* 03/11/2013 0240   AST 128* 03/04/2013 0834   ALT 27 03/11/2013 0240   ALT 68* 03/04/2013 0834   ALKPHOS 478* 03/11/2013 0240   ALKPHOS 730* 03/04/2013 0834   BILITOT 2.6* 03/11/2013 0240   BILITOT 1.54* 03/04/2013 0834   GFRNONAA 24* 03/12/2013 0630   GFRAA 28* 03/12/2013 0630    Chest Xray Reviewed/Impressions:  1. Left chest tube is been trait at L4 a pleural drainage catheter.  No discrete persistent pneumothorax.  2. Low lung volumes, with abnormal accentuated interstitium.  3. Atherosclerosis.  4. Emphysema.   CT scan of the Head Reviewed/Impressions: Large left-sided pneumothorax as shown on previous chest x-ray.  Collapse of the left lung. Left lung mass is obscured by the  pulmonary collapse. Right lung mass is stable. Diffuse emphysema and  fibrosis in the right lung. Secretions in the tracheobronchial tree.  CT ABDOMEN AND PELVIS IMPRESSION  Inflammatory changes in the sigmoid colon consistent with  diverticulitis. No abscess. Cirrhosis of the liver. Heterogeneous  appearance of the liver consistent with known metastasis.  Cholelithiasis.      Time In Time Out Total Time Spent with Patient Total Overall Time   150 p.m.   2:50 PM   60 minutes   60 minutes     Greater than 50%  of this time was spent counseling and coordinating care related to the above assessment and plan.   Skylur Fuston  L. Ladona Ridgel, MD MBA The Palliative Medicine Team at [facility] Phone: [phone] Pager: [phone]

## 2013-03-12 NOTE — Consult Note (Signed)
Patient WU:JWJXB H Shore      DOB: 01-Jul-1932      JYN:[id number]  Summary of Goals of care; full note to follow:  Met with patient's wife at the bedside, daughter had to return to work in [place].    Spouse relates "this is not my Bob".  States she doesn't know what she is going to do without him, but wants him to be comfortable.  Confirmed with Dr. Delford Field the desire to support patient with plt and prbc.  Spouse somewhat tangential in thoughts when I tried to affirm her understanding of comfort care.  She gave me permission to call Vickie.  I am currently awaiting call back regarding the expressed wishes for comfort.  For now patient requiring frequent pain medication.  Nursing was concerned that he would require continuous opiates.     Recommendations:  1.  Affirmed DNR/ DNI  2.  Pain: continue current prn medications which are helping and holding patient but may need to be scheduled if not continuous in the near future.  Will affirm goals with daughter and proceed with further comfort measures as they see them working for their family.  3.  PRBC request by Dr. Delford Field and Shirline Frees  Will addend report as soon as I get a return call from the patient's daughter.  Total time 150-250  Charistopher Rumble L. Ladona Ridgel, MD MBA The Palliative Medicine Team at [facility] Phone: [phone] Pager: [phone]

## 2013-03-12 NOTE — Progress Notes (Addendum)
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 Further he had a Lt pnx that went from  small  To a tension Pnx and required a urgent left chest tube per EDP.  Marland Kitchen SIGNIFICANT EVENTS / STUDIES:  10-20 left ct>>  LINES / TUBES: Rt portacath>>  CULTURES: 10-20 bc x 2>> 10-20 UC>> 10-20 cdiff>>neg 10/20 MRSA PCR >>POS 10-20 stool>>  ANTIBIOTICS: 10-20 vanc>> 10-20 zoysn>>  SUBJECTIVE:  Still with fast RR. C/o abd pain at CA met site to pancreas VITAL SIGNS: Temp:  [98.4 F (36.9 C)-99.4 F (37.4 C)] 98.4 F (36.9 C) (10/21 0400) Pulse Rate:  [62-116] 107 (10/21 0700) Resp:  [17-40] 27 (10/21 0700) BP: (92-183)/(46-109) 140/78 mmHg (10/21 0700) SpO2:  [95 %-100 %] 100 % (10/21 0755) Weight:  [78.1 kg (172 lb 2.9 oz)] 78.1 kg (172 lb 2.9 oz) (10/21 0500) HEMODYNAMICS: CV ok, no pressors   VENTILATOR SETTINGS: On [place]    INTAKE / OUTPUT: Intake/Output     10/20 0701 - 10/21 0700 10/21 0701 - 10/22 0700   I.V. (mL/kg) 955.8 (12.2)    IV Piggyback 212.5    Total Intake(mL/kg) 1168.3 (15)    Urine (mL/kg/hr) 1425 (0.8)    Chest Tube 10 (0)    Total Output 1435     Net -266.8          Stool Occurrence 2 x      PHYSICAL EXAMINATION: General:  Frail elderly wm mild resp distress Neuro:  Follows commands by MAEx4, PERL,speech clear HEENT:  No LAN/JVD Cardiovascular:  HSR RRR Lungs:  diminished air movement thru out, left ct to 20 cm pleurovac no air leak Abdomen: tender. +bs Musculoskeletal: intact Skin:  cool  LABS:  CBC Recent Labs     03/11/13  0240  03/12/13  0630  WBC  0.5*  0.2*  HGB  8.2*   6.0*  HCT  24.2*  17.1*  PLT  53*  18*   Coag's Recent Labs     03/11/13  0240  APTT  39*  INR  1.58*   BMET Recent Labs     03/11/13  0240  03/12/13  0630  NA  129*  134*  K  4.3  4.0  CL  94*  104  CO2  19  18*  BUN  65*  64*  CREATININE  2.75*  2.39*  GLUCOSE  248*  174*   Electrolytes Recent Labs     03/11/13  0240  03/12/13  0630  CALCIUM  8.4  7.7*   Sepsis Markers Recent Labs     03/11/13  0640  PROCALCITON  5.64   ABG Recent Labs     03/12/13  0432  PHART  7.399  PCO2ART  25.5*  PO2ART  118.0*   Liver Enzymes Recent Labs     03/11/13  0240  AST  45*  ALT  27  ALKPHOS  478*  BILITOT  2.6*  ALBUMIN  2.2*   Cardiac Enzymes Recent Labs     03/11/13  0240  TROPONINI  <0.30   Glucose Recent Labs  03/11/13  1046  03/11/13  1514  03/11/13  1916  03/11/13  2322  03/12/13  0334  03/12/13  0801  GLUCAP  219*  161*  156*  158*  146*  141*    Imaging   CXR: 10/21>>no PTX. CT ok  ASSESSMENT / PLAN:  PULMONARY A: SCLCA dx 6-16, chemo per [facility] since 12-04-12 , last 10-13 with poor response and mets to bilary system. PF dx by Dr. Sherene Sires 2007, Now DNR. Focus more on palliation P:   O2 as needed BD's Focus on palliation  DNR, no intubation  CARDIOVASCULAR A: Shock presumed sepsis from lower GI process complicated by tension pnx PAF Significant anemai P:  Transfuse two  unit prbc   RENAL Lab Results  Component Value Date   CREATININE 2.39* 03/12/2013   CREATININE 2.75* 03/11/2013   CREATININE 1.5* 03/04/2013   CREATININE 1.1 02/25/2013   CREATININE 1.4* 02/04/2013   CREATININE 0.95 12/07/2012    A:  Acute renal insuff P:   Hydrate Monitor creatine No HD GASTROINTESTINAL A:  Presumed lower gi infection and inflammation cannot r/o cancer spread , Inflammatory process in diverticulum C diff NEG P:   PPI Abx Culture stool  HEMATOLOGIC A:  Thrombocytopenia leukopenia presumed from recent chemo P:  Monitor  labs Minimize invasive procedures Transfuse plt and prbc neupogen  INFECTIOUS A: Presumed lower GI infection, sepsis severe P:   See flows for abx and micro  ENDOCRINE A:   DM Chronic decadron treatment with chemo P:   SSI Empiric steroids  NEUROLOGIC A:  Frail but appears intact P:   ICU monitoring   I updated spouse  at bedside.  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 Further he had a Lt pnx that went from  small  To a tension Pnx and required a urgent left chest tube per EDP. Pt with diverticulitis on exam/imaging studies.  Plan: PRBC,PLT support, DNR, no intubation.  Focus more on palliation  CC [address]  Caryl Bis  [phone]  Cell  [phone]  If no response or cell goes to voicemail, call beeper [phone]

## 2013-03-13 ENCOUNTER — Inpatient Hospital Stay (HOSPITAL_COMMUNITY): Payer: Medicare Other

## 2013-03-13 DIAGNOSIS — R6520 Severe sepsis without septic shock: Secondary | ICD-10-CM

## 2013-03-13 DIAGNOSIS — D61810 Antineoplastic chemotherapy induced pancytopenia: Secondary | ICD-10-CM

## 2013-03-13 DIAGNOSIS — T451X5A Adverse effect of antineoplastic and immunosuppressive drugs, initial encounter: Secondary | ICD-10-CM

## 2013-03-13 DIAGNOSIS — Z515 Encounter for palliative care: Secondary | ICD-10-CM

## 2013-03-13 DIAGNOSIS — N259 Disorder resulting from impaired renal tubular function, unspecified: Secondary | ICD-10-CM

## 2013-03-13 DIAGNOSIS — A419 Sepsis, unspecified organism: Secondary | ICD-10-CM

## 2013-03-13 DIAGNOSIS — C3490 Malignant neoplasm of unspecified part of unspecified bronchus or lung: Secondary | ICD-10-CM

## 2013-03-13 LAB — CBC WITH DIFFERENTIAL/PLATELET
Basophils Absolute: 0 10*3/uL (ref 0.0–0.1)
Basophils Relative: 0 % (ref 0–1)
Eosinophils Absolute: 0 10*3/uL (ref 0.0–0.7)
Hemoglobin: 7.8 g/dL — ABNORMAL LOW (ref 13.0–17.0)
Lymphocytes Relative: 47 % — ABNORMAL HIGH (ref 12–46)
MCH: 33.3 pg (ref 26.0–34.0)
MCHC: 35 g/dL (ref 30.0–36.0)
Monocytes Absolute: 0.1 10*3/uL (ref 0.1–1.0)
Monocytes Relative: 29 % — ABNORMAL HIGH (ref 3–12)
Neutro Abs: 0 10*3/uL — ABNORMAL LOW (ref 1.7–7.7)
Neutrophils Relative %: 24 % — ABNORMAL LOW (ref 43–77)
Platelets: 39 10*3/uL — ABNORMAL LOW (ref 150–400)
RBC: 2.34 MIL/uL — ABNORMAL LOW (ref 4.22–5.81)
RDW: 23 % — ABNORMAL HIGH (ref 11.5–15.5)
WBC: 0.2 10*3/uL — CL (ref 4.0–10.5)

## 2013-03-13 LAB — COMPREHENSIVE METABOLIC PANEL
AST: 15 U/L (ref 0–37)
CO2: 18 mEq/L — ABNORMAL LOW (ref 19–32)
Calcium: 8.7 mg/dL (ref 8.4–10.5)
Chloride: 106 mEq/L (ref 96–112)
Creatinine, Ser: 2.33 mg/dL — ABNORMAL HIGH (ref 0.50–1.35)
GFR calc Af Amer: 29 mL/min — ABNORMAL LOW (ref >90)
GFR calc non Af Amer: 25 mL/min — ABNORMAL LOW (ref >90)
Glucose, Bld: 176 mg/dL — ABNORMAL HIGH (ref 70–99)
Total Protein: 5.8 g/dL — ABNORMAL LOW (ref 6.0–8.3)

## 2013-03-13 LAB — PREPARE PLATELET PHERESIS

## 2013-03-13 LAB — GLUCOSE, CAPILLARY
Glucose-Capillary: 187 mg/dL — ABNORMAL HIGH (ref 70–99)
Glucose-Capillary: 191 mg/dL — ABNORMAL HIGH (ref 70–99)

## 2013-03-13 MED ORDER — FENTANYL BOLUS VIA INFUSION: 20.0000 ug | INTRAVENOUS | Status: AC | PRN

## 2013-03-13 MED ORDER — LORAZEPAM 2 MG/ML IJ SOLN: 0.5000 mg | INTRAMUSCULAR | Status: DC | PRN

## 2013-03-13 MED ORDER — SODIUM CHLORIDE 0.9 % IJ SOLN: 10.0000 mL | INTRAMUSCULAR | Status: AC | PRN

## 2013-03-13 MED ORDER — FENTANYL CITRATE 0.05 MG/ML IJ SOLN: 50.0000 ug | INTRAMUSCULAR | Status: AC | PRN

## 2013-03-13 MED ORDER — SODIUM CHLORIDE 0.9 % IV SOLN
20.0000 ug/h | INTRAVENOUS | Status: DC
  Administered 2013-03-13: 20 ug/h via INTRAVENOUS
  Filled 2013-03-13: qty 50

## 2013-03-13 MED ORDER — SODIUM CHLORIDE 0.9 % IV SOLN: 20.0000 ug/h | INTRAVENOUS | Status: AC

## 2013-03-13 MED ORDER — IPRATROPIUM BROMIDE 0.02 % IN SOLN: 0.5000 mg | Freq: Four times a day (QID) | RESPIRATORY_TRACT | Status: AC

## 2013-03-13 MED ORDER — PROCHLORPERAZINE EDISYLATE 5 MG/ML IJ SOLN: 10.0000 mg | Freq: Four times a day (QID) | INTRAMUSCULAR | Status: AC | PRN

## 2013-03-13 MED ORDER — BOOST / RESOURCE BREEZE PO LIQD: 1.0000 | ORAL | Status: DC

## 2013-03-13 MED ORDER — ALBUTEROL SULFATE (5 MG/ML) 0.5% IN NEBU: 2.5000 mg | INHALATION_SOLUTION | Freq: Four times a day (QID) | RESPIRATORY_TRACT | Status: AC

## 2013-03-13 MED ORDER — LORAZEPAM 2 MG/ML IJ SOLN: 0.5000 mg | INTRAMUSCULAR | Status: AC | PRN

## 2013-03-13 MED ORDER — FENTANYL BOLUS VIA INFUSION
20.0000 ug | INTRAVENOUS | Status: DC | PRN
  Filled 2013-03-13: qty 20

## 2013-03-13 MED ORDER — GLUCERNA SHAKE PO LIQD
237.0000 mL | ORAL | Status: DC
  Filled 2013-03-13: qty 237

## 2013-03-13 NOTE — Discharge Summary (Signed)
Physician Discharge Summary     Patient ID: Phillip [name] MRN: [id number] DOB/AGE: 77/01/1933 77 y.o.  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 Further he had a Lt pnx that went from small to a tension Pnx and required a urgent left chest tube per EDP. PCCM asked to admit.  His chemo was recently changed to ,EMEND, ALOXI,Decadron, last chemo was 10-13. He has proved refractory to chemo. He was followed by Dr. Sherene Sires and carries dx of O2 dependent PF.  He was admitted to the ICU for further supportive care. Therapeutic interventions included Chest-tube, scheduled BDs, systemic steroids and empiric antibiotics. His hospital course was complicated by CT becoming dislodged on 10/20 which required [place] to replace it with a [facility] cath. He was placed to wall suction.  Due to the over-all decline and poor overall prognosis a palliative care consult was obtained. After this the plan of care as agreed upon by family was: full DNR/DNI, and medical therapy. His condition continued to deteriorate and based on this the family agreed to transition to full palliative care focus on 10/22. He has a bed at residential hospice, which the family is in support of. He will be transferred to residential hospice for further palliative care efforts.   Plan: Discharge to residential hospice Fentanyl gtt Chest tube to water seal  Focus on comfort   Significant Hospital tests/ studies/ interventions and procedures  LINES / TUBES:  Rt  portacath>>  Left wayne cath 10/20>>>  CULTURES:  10-20 bc x 2>>  10-20 UC>> neg 10-20 cdiff>>neg  10/20 MRSA PCR >>POS  10-20 stool>>   ANTIBIOTICS:  10-20 vanc>> 10/22 10-20 zoysn>>10/22   Consults Palliative care  Heme/onc    Discharge Exam: BP 128/67  Pulse 99  Temp(Src) 99.5 F (37.5 C) (Axillary)  Resp 26  Ht 5' 8.9" (1.75 m)  Wt 76.8 kg (169 lb 5 oz)  BMI 25.08 kg/m2  SpO2 96%  PHYSICAL EXAMINATION:  General: Frail elderly wm mild resp distress  Neuro: Follows commands by MAEx4, PERL,speech clear  HEENT: No LAN/JVD  Cardiovascular: HSR RRR  Lungs: diminished air movement thru out, left ct w/ 1/7 airleak  Abdomen: tender. +bs  Musculoskeletal: intact  Labs at discharge Lab Results  Component Value Date   CREATININE 2.33* 03/13/2013   BUN 60* 03/13/2013   NA 139 03/13/2013   K 3.0* 03/13/2013   CL 106 03/13/2013   CO2 18* 03/13/2013   Lab Results  Component Value Date   WBC 0.2* 03/13/2013   HGB 7.8* 03/13/2013   HCT 22.3* 03/13/2013   MCV 95.3 03/13/2013   PLT 39* 03/13/2013   Lab Results  Component Value Date   ALT 15 03/13/2013   AST 15 03/13/2013   ALKPHOS 221* 03/13/2013   BILITOT 2.7* 03/13/2013   Lab Results  Component Value Date   INR 1.58* 03/11/2013   INR 1.09 01/09/2013   INR 1.16 12/03/2012    Current radiology studies Dg Chest Port 1 View  03/13/2013   CLINICAL DATA:  Left-sided chest tube  EXAM: PORTABLE CHEST - 1 VIEW  COMPARISON:  03/11/2013  FINDINGS: The cardiac shadow is stable. A left-sided chest tube is again identified and stable. There remains a left pneumothorax which is increased slightly in the interval from the prior exam. The right lung is clear but with some interstitial fibrotic changes. A right-sided chest wall port is again seen.  IMPRESSION: Increasing left-sided pneumothorax when compared with the previous exam.   Electronically Signed   By: Alcide Clever M.D.   On: 03/13/2013 07:02    Disposition:   Residential Hospice      Discharge Orders   Future Appointments Provider Department Dept Phone   03/18/2013 8:15 AM Krista Blue [facility] MED[facility] [phone]   03/18/2013 8:45 AM Marlana Salvage [facility] MED[facility] ONCOL[facility]Y [phone]   03/18/2013 9:45 AM [facility] [place] [facility] [phone]   03/25/2013 8:30 AM Delcie Roch [place] [facility]COL[facility] [phone]   03/25/2013 9:00 AM [facility] [place] [facility] MED[facility]COL[facility] [phone]   Future Orders Complete By Expires   Type and screen  03/12/2013 03/12/2014   Care order/instruction  As directed 03/15/2013   Comments:     Transfuse Parameters   Diet - low sodium heart healthy  As directed    Increase activity slowly  As directed        Medication List    STOP taking these medications       allopurinol 100 MG tablet  Commonly known as:  ZYLOPRIM     aspirin 81 MG tablet     bumetanide 1 MG tablet  Commonly known as:  BUMEX     chlorproMAZINE 25 MG tablet  Commonly known as:  THORAZINE     HUMALOG KWIKPEN 100 UNIT/ML Sopn  Generic drug:  insulin lispro     HYDROcodone-acetaminophen 10-325 MG per tablet  Commonly known as:  NORCO     ibuprofen 200 MG tablet  Commonly known as:  ADVIL,MOTRIN     insulin NPH-regular (70-30) 100 UNIT/ML injection  Commonly known as:  NOVOLIN 70/30     lidocaine-prilocaine cream  Commonly known as:  EMLA     LORazepam 1 MG tablet  Commonly known as:  ATIVAN  Replaced by:  LORazepam 2 MG/ML injection     losartan 100 MG tablet  Commonly known as:  COZAAR     MAGNESIUM-CHELATED ZINC PO     methylPREDNISolone 4 MG tablet  Commonly known as:  MEDROL DOSEPAK     multivitamin with minerals Tabs tablet     prochlorperazine 10 MG tablet  Commonly known as:  COMPAZINE     traZODone 50 MG tablet  Commonly known as:  DESYREL      TAKE these medications        albuterol (5 MG/ML) 0.5% nebulizer solution  Commonly known as:  PROVENTIL  Take 0.5 mLs (2.5 mg total) by nebulization every 6 (six) hours.     fentaNYL Soln  Commonly known as:  SUBLIMAZE  Inject 20 mcg into the vein every 30 (thirty) minutes as needed (DYSPNEA).     fentaNYL 0.05 MG/ML injection  Commonly known as:  SUBLIMAZE  Inject 1-2 mLs (50-100 mcg total) into the vein every hour as needed for moderate pain.     ipratropium 0.02 % nebulizer solution  Commonly known as:  ATROVENT  Take 2.5 mLs (0.5 mg total) by nebulization every 6 (six) hours.  LORazepam 2 MG/ML injection  Commonly known as:  ATIVAN  Inject 0.25 mLs (0.5 mg total) into the vein every 4 (four) hours as needed for anxiety or sedation.     prochlorperazine 5 MG/ML injection  Commonly known as:  COMPAZINE  Inject 2 mLs (10 mg total) into the vein every 6 (six) hours as needed (hiccups).     sodium chloride 0.9 % injection  10 mLs by Intracatheter route as needed.     sodium chloride 0.9 % SOLN 200 mL with fentaNYL 0.05 MG/ML SOLN 2,500 mcg  Inject 20 mcg/hr into the vein continuous.         Discharged Condition: terminal   Physician Statement:   The Patient was personally examined, the discharge assessment and plan has been personally reviewed and I agree with ACNP Babcock's assessment and plan. > 30 minutes of time have been dedicated to discharge assessment, planning and discharge instructions.   Signed: BABCOCK,PETE 03/13/2013, 11:33 AM  I agree with the above plan of care. Plan tfr to [facility] unit .  Dorcas Carrow Beeper  [phone]  Cell  [phone]  If no response or cell goes to voicemail, call beeper [phone]

## 2013-03-13 NOTE — Progress Notes (Signed)
RN callig elink   Recent Labs Lab 03/11/13 0240 03/12/13 0630  HGB 8.2* 6.0*   Due to hgb 6mg % due to chemo (no bleeding per rN), 2 u prbc ordered  Now post 1 unit, temp up and bp up  Plan Stop PRBC at one unit. No 2nd unit prbc. Cancel 2nd unit prbc  Dr. Kalman Shan, M.D., [facility].C.P Pulmonary and Critical Care Medicine Staff Physician [facility] [place] Pulmonary and Critical Care Pager: [phone], If no answer or between  15:00h - 7:00h: call [phone]  03/13/2013 1:46 AM

## 2013-03-13 NOTE — Progress Notes (Signed)
Subjective:  The patient is seen and examined today. His wife was at the bedside. He is very lethargic, reports feeling very tired after a "rough night". 1 unit of blood out of 2 was given after fever and chills were noted. Fever was 100.5 axillary. 1 unit of platelets were received as well. He was evaluated by Palliative Care, and transfer to [facility] is pending. Daughter wishes comfort care only. Per chart report "Nothing that prolongs her father's life or suffering including antibiotics, feeding tubes etc. She has discussed continuous opiate infusions for pain and dyspnea control and has discussed this with her step mother"   Objective: Vital signs in last 24 hours: Temp:  [97.5 F (36.4 C)-101.4 F (38.6 C)] 98.2 F (36.8 C) (10/22 0545) Pulse Rate:  [93-120] 111 (10/22 0800) Resp:  [17-34] 31 (10/22 0800) BP: (91-165)/(49-95) 122/77 mmHg (10/22 0800) SpO2:  [93 %-100 %] 100 % (10/22 0854) Weight:  [169 lb 5 oz (76.8 kg)] 169 lb 5 oz (76.8 kg) (10/22 0500)  Intake/Output from previous day: 10/21 0701 - 10/22 0700 In: 2418.5 [I.V.:1725; Blood:593.5; IV Piggyback:100] Out: 1341 [Urine:1325; Chest Tube:16] Intake/Output this shift: Total I/O In: 30 [P.O.:30] Out: 60 [Urine:60]  General appearance: alert,lethargicd, fatigued and NAD. Pale, mild jaundice.  Resp: diminished breath sounds RLL and RML and dullness to percussion RLL and RML. Left chest tube in place. Cardio: Tachycardic with no murmur or gallop. GI: Slightly distended with tenderness to palpation in the left lower quadrant Extremities: edema 2+  Lab Results:   Recent Labs  03/12/13 0630 03/13/13 0440  WBC 0.2* 0.2*  HGB 6.0* 7.8*  HCT 17.1* 22.3*  PLT 18* 39*   BMET  Recent Labs  03/12/13 0630 03/13/13 0440  NA 134* 139  K 4.0 3.0*  CL 104 106  CO2 18* 18*  GLUCOSE 174* 176*  BUN 64* 60*  CREATININE 2.39* 2.33*  CALCIUM 7.7* 8.7    Studies/Results: Dg Chest Port 1 View  03/13/2013    CLINICAL DATA:  Left-sided chest tube  EXAM: PORTABLE CHEST - 1 VIEW  COMPARISON:  03/11/2013  FINDINGS: The cardiac shadow is stable. A left-sided chest tube is again identified and stable. There remains a left pneumothorax which is increased slightly in the interval from the prior exam. The right lung is clear but with some interstitial fibrotic changes. A right-sided chest wall port is again seen.  IMPRESSION: Increasing left-sided pneumothorax when compared with the previous exam.   Electronically Signed   By: Alcide Clever M.D.   On: 03/13/2013 07:02   Dg Chest Port 1 View  03/11/2013   CLINICAL DATA:  Chest tube placement  EXAM: PORTABLE CHEST - 1 VIEW  COMPARISON:  03/11/2013  FINDINGS: Abnormal interstitial accentuation persists. Low lung volumes. Right Port-A-Cath tip: Cavoatrial junction. Larger caliber chest tube on the left is been treated out for a pleural drainage catheter. No discrete pneumothorax. Small amount of subcutaneous gas on the left.  Emphysema is noted.  IMPRESSION: 1. Left chest tube is been trait at L4 a pleural drainage catheter. No discrete persistent pneumothorax. 2. Low lung volumes, with abnormal accentuated interstitium. 3. Atherosclerosis. 4. Emphysema.   Electronically Signed   By: Herbie Baltimore M.D.   On: 03/11/2013 11:01    Medications: I have reviewed the patient's current medications.  Assessment/Plan: 1) extensive stage small cell lung cancer with recent disease progression currently undergoing systemic chemotherapy with cisplatin and irinotecan status post 1 cycle. Unfortunately the patient has very  poor prognosis and I am not sure if he would be able to tolerate any further chemotherapy.Case discussed with the patient and his wife Awaiting transfer at one point to [facility]. 2) severe pancytopenia: secondary to her recent chemotherapy and probably septic shock.S/p 1 of 2 units of PRBCs transfusion on 10/21 due to fever and chills.  S/p  1 unit of  platelets on 10/21 Initiated on  Neupogen 480 mcg subcutaneously on 10/21 daily until his absolute neutrophil count is over 1000. 3) Septic shock and diverticulitis: continue current antibiotics with Zosyn and vancomycin. 4) respiratory failure: secondary to the above. Continue supportive care by the critical care team. Thank you so much for taken good care of Mr. Linder. I will continue to follow the patient with you and assist in his management. Case to be discussed with Dr. Phillips Odor telephone [phone] prior to discharge to Hospice, specifically regarding his counts. As stated above daughter wishes no further supportive therapy or antibiotics IV. All issues to be addressed prior to transfer.   5. Pain, controlled. Fentanyl started.    LOS: 2 days    [facility] E 03/13/2013  ADDENDUM: Hematology/Oncology Attending: The patient is seen and examined today. I agree with the above note. Unfortunately the patient continues to decline rapidly. According to Dr. Delford Field and Dr. Phillips Odor, the family decided only on comfort care at this point. They requested discontinuation of the antibiotics as well as the other supportive measures including Neupogen for the neutropenia. The patient is expected to be discharged to the hospice facility in [facility]. He would be followed there by Dr. Cleone Slim who knows the patient and his family for long time. I offered my support his wife who was at the bedside.

## 2013-03-13 NOTE — Progress Notes (Signed)
Patient receiving one unit of RBC.  At completion of transfusion, patient temperature elevated.  Blood pressure also elevated with transfusion.  MD notified, new orders given.  Will continue to monitor.  Kinnie Feil, RN

## 2013-03-13 NOTE — Progress Notes (Signed)
CSW received referral stating that pt has bed at [facility] [place] ASAP.  CSW contacted [facility] and facility confirmed that they had bed available for pt today.  CSW spoke with PCCM MD and NP re: availability of residential hospice bed.  Per MD, have to assess patient and determine if pt is stable for transfer.  CSW provided PCCM NP CSW contact information in order for CSW to be notified of determination.  CSW notified [facility] that CSW awaiting MD to round on pt to determine if pt stable for transfer today.  Will update [facility] and engage with family when further determination made.  Jacklynn Lewis, MSW, LCSWA  Clinical Social Work [phone]

## 2013-03-13 NOTE — Progress Notes (Signed)
Pt for discharge to [facility] today.  CSW facilitated pt discharge needs including contacting the facility, faxing pt discharge information to Residential Hospice facility, providing RN phone number to call report, discussing with pt wife at bedside, and arranging ambulance transport for pt to [facility] [place].  No further social work needs identified at this time.  CSW signing off.   Jacklynn Lewis, MSW, LCSWA  Clinical Social Work [phone]

## 2013-03-13 NOTE — Progress Notes (Signed)
Clinical Social Work Department BRIEF PSYCHOSOCIAL ASSESSMENT 03/13/2013  Patient:  Phillip [name], Phillip [name]     Account Number:  [id number]     Admit date:  03/11/2013  Clinical Social Worker:  Jacelyn Grip  Date/Time:  03/13/2013 12:30 PM  Referred by:  Physician  Date Referred:  03/13/2013 Referred for  Residential hospice placement   Other Referral:   Interview type:  Family Other interview type:    PSYCHOSOCIAL DATA Living Status:  WIFE Admitted from facility:   Level of care:   Primary support name:  Phillip [name]/wife/[phone] Primary support relationship to patient:  SPOUSE Degree of support available:   strong    CURRENT CONCERNS Current Concerns  Post-Acute Placement   Other Concerns:    SOCIAL WORK ASSESSMENT / PLAN CSW received notification from PCCM NP, Anders Simmonds stating that pt is stable for transfer to [facility] [place] today.    CSW notified [facility].    CSW met with pt and pt wife at bedside. Pt resting at this time and appeared comfortable. CSW discussed with pt wife that [facility] is able to accept today and CSW discussed process of transition to residential hospice.    CSW to facilitate pt discharge needs to [facility] [place].   Assessment/plan status:  Psychosocial Support/Ongoing Assessment of Needs Other assessment/ plan:   discharge planning   Information/referral to community resources:   Referral [facility]    PATIENT'S/FAMILY'S RESPONSE TO PLAN OF CARE: Pt unable to engage in assessment, but appears to be resting comfortably. Pt wife appears very supportive and involved in pt care and is relieved that pt is being transferred to hospice home close to where pt family lives.    Phillip [name], MSW, LCSWA  Clinical Social Work [phone]

## 2013-03-13 NOTE — Progress Notes (Addendum)
Palliative Medicine Team Progress Note  [name] continues to decline. He is pale and complains of worsening dyspnea. Issues with fever last PM to 101 after receiving 1 unit of platelets and 1 unit of blood. His wife Ms. Phillip [name] is at bedside resting comfortably but herself appears very weak. I spoke with his daughter Phillip [name] who is in [place]. Goals for [name] are full comfort care and Hospice.  Filed Vitals:   03/13/13 0700  BP: 107/71  Pulse: 93  Temp:   Resp: 20   Pale, cachectic, very weak but responsive Shallow course respirations, CTu in place  Assessment:  1. Extensive stage small cell lung cancer with bilateral pulmonary nodules, mediastinal and supraclavicular lymphadenopathy as well as liver and bone metastases diagnosed in July of 2014.  2. Left Pneumothorax, chest tube in place  3. Diarrhea, GI inflammation  Recommendations:  [name] is nearing end of life after a brief but intense treatment attempt with systemic chemotherapy 3 months ago for his very advanced Phillip [name] lung cancer. He is currently in the ICU receiving a very high level of care but continues to decline. He has profound neutropenia with septic shock from possible intra-abdominal infection.  After extensive goals of care discussion family and patient desire full comfort and a focus on symptom management and QOL.   1. D/C all non-essential medications including antibiotics-at this point in his disease process they are not contributing to QOL and are prolonging a period of suffering for this gentleman. His daughter agrees to this and very much agrees with this approach given his very advanced cancer and near death from chemotherapy.His medical options moving forward are quite limited given his very debilitated state at baseline. 2. No additional transfusions. 3. Will initiate a Fentanyl infusion -he has tolerated this opiate very well in ICU, bolus via infusion 4. Comfort feeding 5. D/c CBG 6. No additional  labwork   Disposition: Family specifically requesting ASAP transfer to [facility] is very appropriate for inpatient hospice given his dyspnea and chest tube management needs for his comfort. Patient's wife knows Dr. Cleone Slim and he is aware of patient's need for admission. I think a move to [facility] is reasonable if they can get a bed for him today but he is very fragile. This patient and wife very much need the assistance of Hospice-if [facility] does not have a bed then I would recommend [facility] here at [facility].  Anderson Malta, DO Palliative Medicine

## 2013-03-13 NOTE — Progress Notes (Signed)
Discharged via EMS to [facility].

## 2013-03-14 ENCOUNTER — Telehealth: Payer: Self-pay | Admitting: Internal Medicine

## 2013-03-14 DIAGNOSIS — Z515 Encounter for palliative care: Secondary | ICD-10-CM

## 2013-03-15 LAB — STOOL CULTURE

## 2013-03-16 LAB — TYPE AND SCREEN
ABO/RH(D): A POS
Antibody Screen: NEGATIVE
Unit division: 0

## 2013-03-17 LAB — CULTURE, BLOOD (ROUTINE X 2)
Culture: NO GROWTH
Culture: NO GROWTH

## 2013-03-18 ENCOUNTER — Ambulatory Visit: Payer: Medicare Other

## 2013-03-18 ENCOUNTER — Other Ambulatory Visit: Payer: Medicare Other | Admitting: Lab

## 2013-03-18 ENCOUNTER — Ambulatory Visit: Payer: Medicare Other | Admitting: Physician Assistant

## 2013-03-18 ENCOUNTER — Telehealth: Payer: Self-pay | Admitting: Medical Oncology

## 2013-03-18 NOTE — Telephone Encounter (Signed)
Wife called to tell us pt died 10-09-22 . I called back to express our condolences .

## 2013-03-23 NOTE — Telephone Encounter (Signed)
pt wife called to advised that MR. is in hospice and need to cx appt.

## 2013-03-23 DEATH — deceased

## 2013-03-25 ENCOUNTER — Other Ambulatory Visit: Payer: Medicare Other | Admitting: Lab

## 2013-03-25 ENCOUNTER — Ambulatory Visit: Payer: Medicare Other

## 2013-03-28 ENCOUNTER — Other Ambulatory Visit: Payer: Self-pay

## 2013-10-19 IMAGING — CT CT ABD-PELV W/ CM
2 of 5 series · 15 of 46 positions shown, 17 images · IV contrast (OMNIPAQUE)
Comparison: Abdomen and pelvis CT from [birth year]1939.  PET CT from
11/15/2012.

CT CHEST

CLINICAL DATA: Small cell lung cancer

CT CHEST, ABDOMEN AND PELVIS WITH CONTRAST
TECHNIQUE: Multidetector CT imaging of the chest, abdomen and
pelvis was performed following the standard protocol during bolus
administration of intravenous contrast.
Contrast: 80mL OMNIPAQUE IOHEXOL 300 MG/ML  SOLN

[Series 2: cap with st · axial · 0.73mm/px · z∈[-616,-36]mm · 12 of 132 slices shown, 14 images]
[im 8/132  soft-tissue]
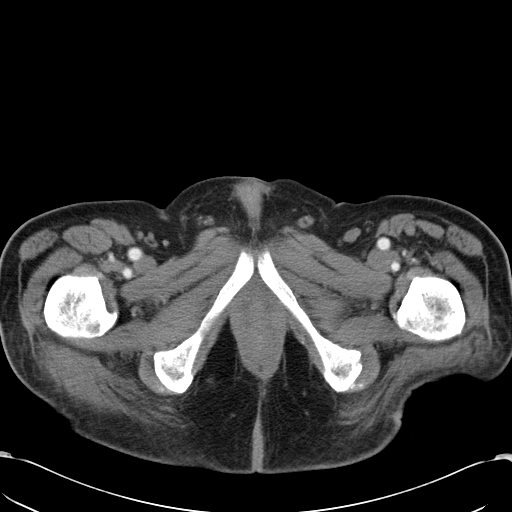
[im 8/132  bone]
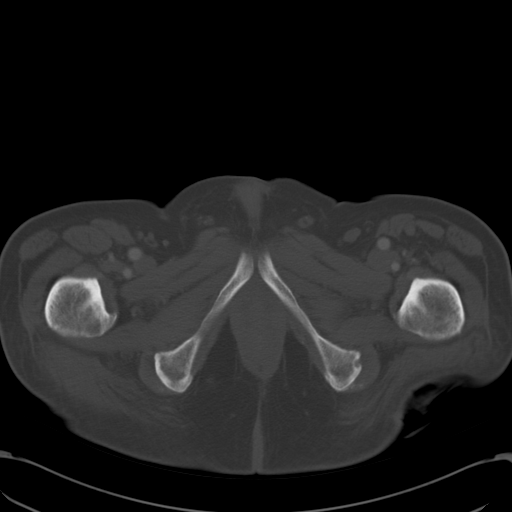
[im 22/132  soft-tissue]
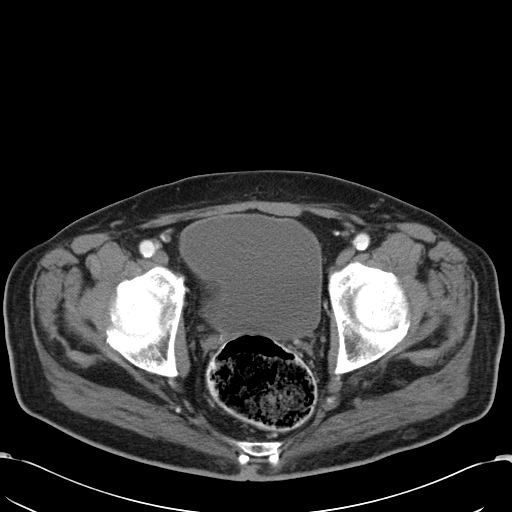
[im 30/132  soft-tissue]
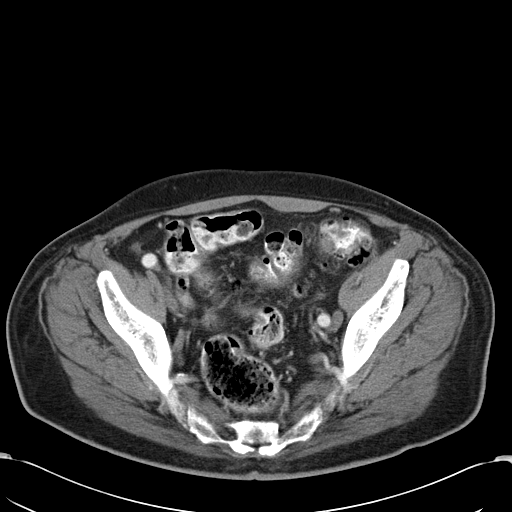
[im 37/132  soft-tissue]
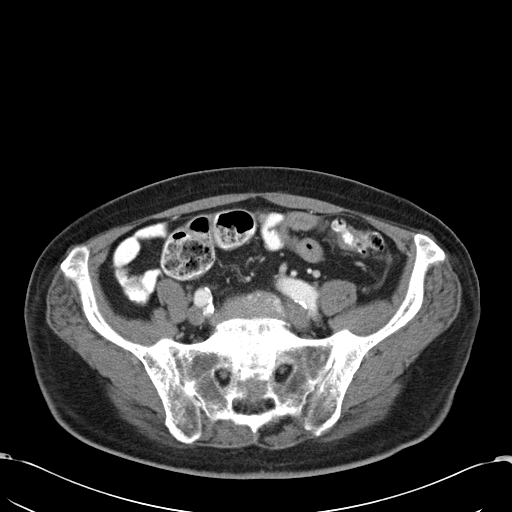
[im 51/132  soft-tissue]
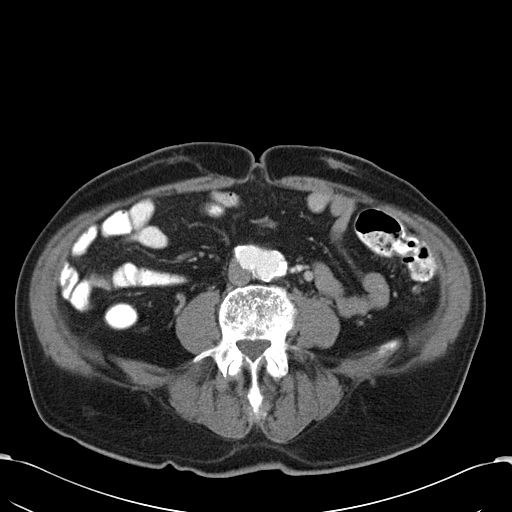
[im 59/132  soft-tissue]
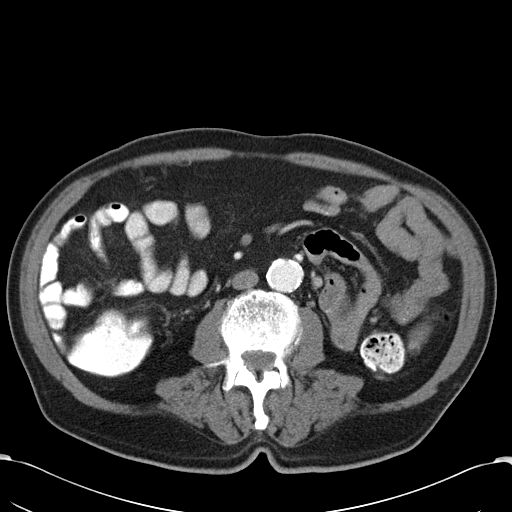
[im 73/132  soft-tissue]
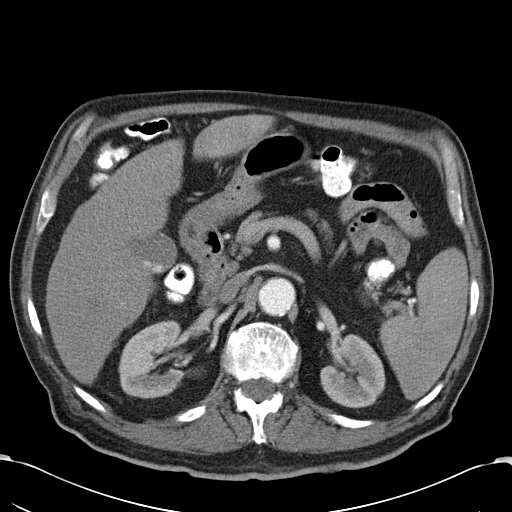
[im 81/132  soft-tissue]
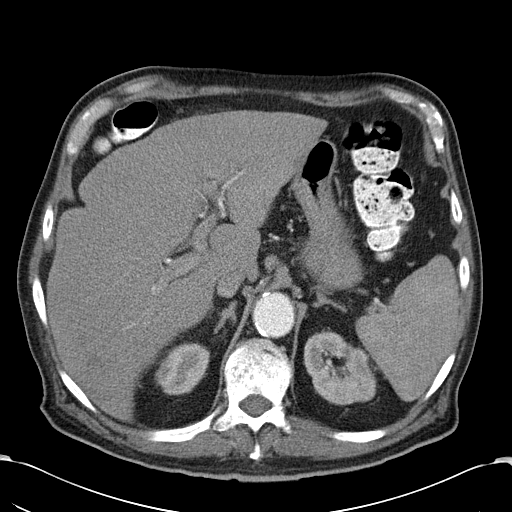
[im 95/132  soft-tissue]
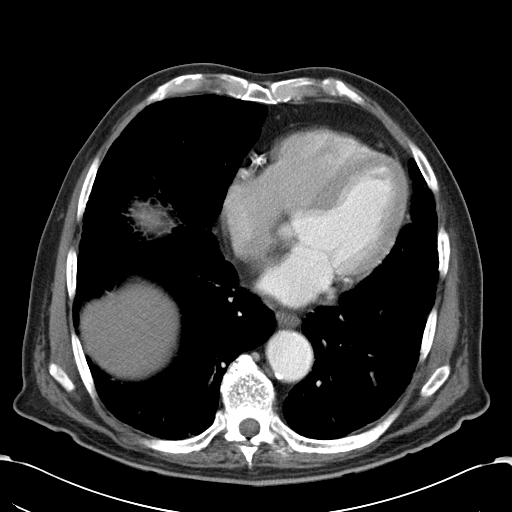
[im 95/132  bone]
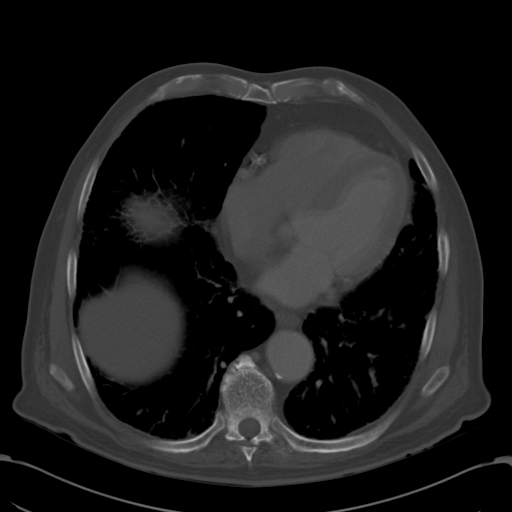
[im 102/132  soft-tissue]
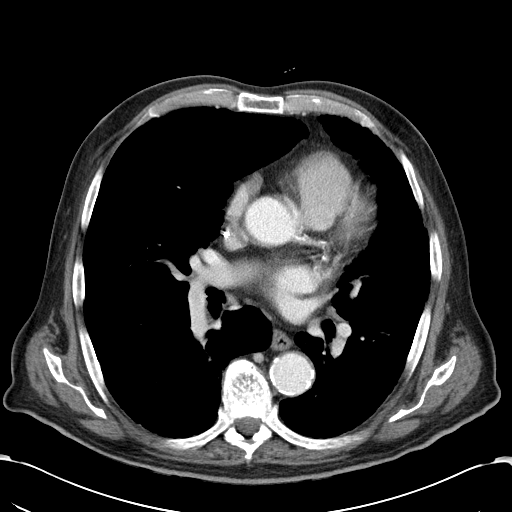
[im 110/132  soft-tissue]
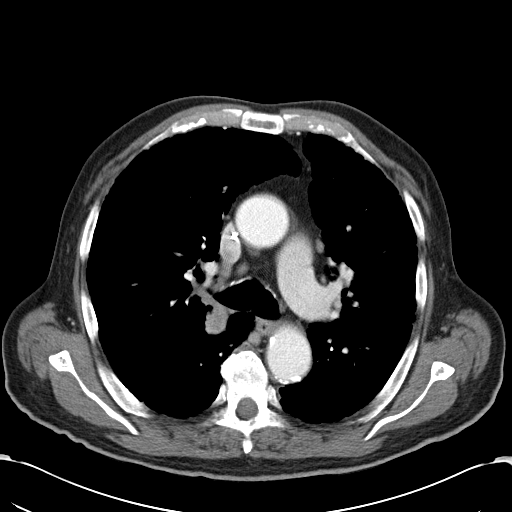
[im 124/132  soft-tissue]
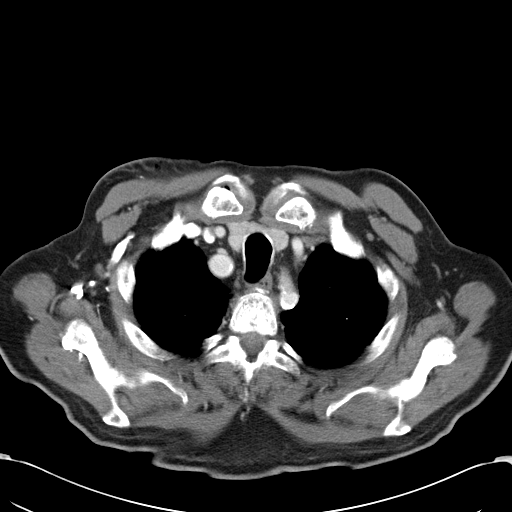

[Series 602: cor · coronal · 1.28mm/px · 3 of 94 slices shown]
[im 32/94  soft-tissue]
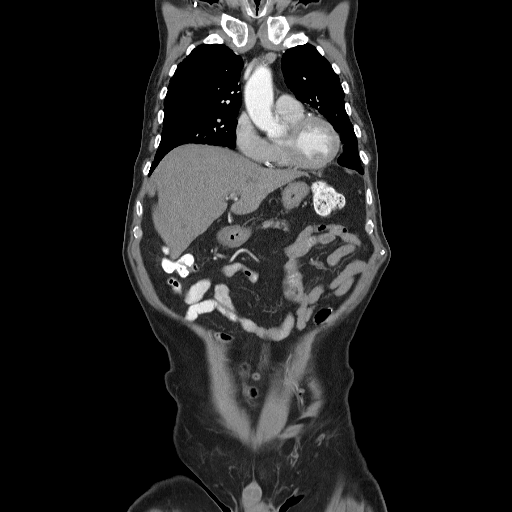
[im 42/94  soft-tissue]
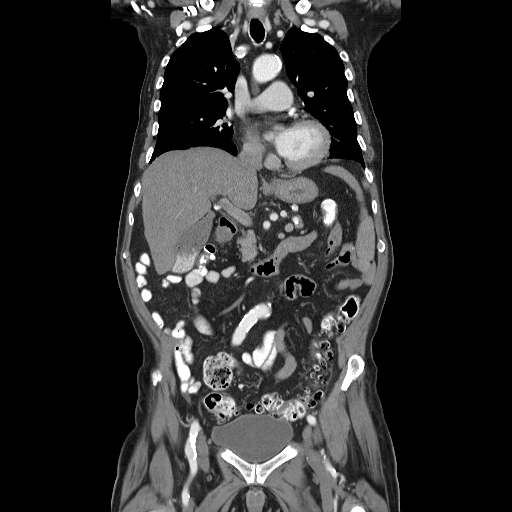
[im 52/94  soft-tissue]
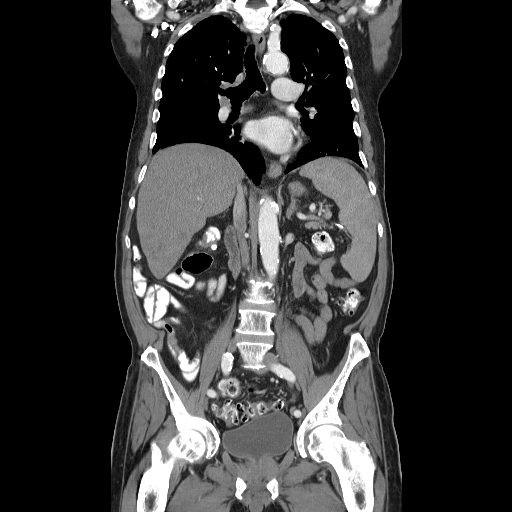

[15 of 46 positions shown; findings below may reference images not displayed]

FINDINGS: The tip of the right-sided Port-A-Cath is positioned in
the distal SVC.  Tiny gas locules around the port device are
presumably secondary to recent port placement 2 days ago.

There is no axillary lymphadenopathy.  2.1 cm posterior right hilar
lymph node is associated with other smaller lymph nodes in the
right hilum.  No subcarinal or left hilar lymphadenopathy.  The
heart size is normal. Coronary artery calcification is noted.
There is no pericardial or pleural effusion.

Lung windows show advanced emphysema bilaterally. 18 mm posterior
left upper lobe pulmonary nodule is visible on image 18. 2.3 x
cm posteromedial right upper lobe nodules seen just posterior to
the right hilum.  Peripheral honeycombing is seen in the lungs
bilaterally suggesting a degree of underlying UIP.

Bone windows reveal no worrisome lytic or sclerotic osseous
lesions.
IMPRESSION: Bilateral pulmonary nodules, suspicious for metastatic disease.

Emphysema with underlying interstitial lung disease.  Peripheral
honeycombing suggest a component of underlying UIP.

CT ABDOMEN AND PELVIS
FINDINGS: Numerous low-density lesions are seen scattered
throughout the liver, as before.  These appear improved in the
interval.  13 mm lesion in the tip of the lateral segment left
liver was 29 mm previously.  15 mm lesion in the lateral right
liver was 23 mm previously.

The spleen is unremarkable.  Stomach is decompressed.  Duodenum,
pancreas, and adrenal glands are unremarkable.  Numerous tiny
calcified stones (measuring in the 5 mm size range) are noted in
the gallbladder.  Cortical scarring is evident within the kidneys
bilaterally no dominant renal mass is evident.  Tiny low-density
cortical lesions in the left kidney are probably cysts.

No abdominal aortic aneurysm although atherosclerotic calcification
is noted in the wall of the abdominal aorta.  There is no
retroperitoneal lymphadenopathy.  No gastrohepatic ligament
lymphadenopathy.  There is mild lymphadenopathy in the
hepatoduodenal ligament with a 2.9 x 1.8 cm lymph node identified
in the portocaval space.

Imaging through the pelvis shows no free intraperitoneal fluid.
There is no pelvic sidewall lymphadenopathy.  Bladder is moderately
distended.  Prostate gland is enlarged.  Advanced diverticulosis
noted in the left colon without evidence for diverticulitis.  The
terminal ileum and the appendix are normal.

Bone windows reveal no worrisome lytic or sclerotic osseous
lesions.
IMPRESSION: Interval decrease in the numerous lesions scattered throughout both
hepatic lobes, consistent with improving metastatic disease used.

Interval decrease in hepatoduodenal ligament lymphadenopathy.

Cholelithiasis.

Atherosclerosis.

Diverticulosis without diverticulitis.

## 2014-01-14 ENCOUNTER — Other Ambulatory Visit: Payer: Self-pay | Admitting: *Deleted
